# Patient Record
Sex: Female | Born: 1945 | Race: White | Hispanic: No | Smoking: Former smoker
Health system: Southern US, Community
[De-identification: ages and names within clinical notes are randomized; demographics above are authoritative.]

## PROBLEM LIST (undated history)

## (undated) DIAGNOSIS — R0789 Other chest pain: Secondary | ICD-10-CM

## (undated) DIAGNOSIS — R002 Palpitations: Secondary | ICD-10-CM

## (undated) HISTORY — DX: Palpitations: R00.2

## (undated) HISTORY — DX: Other chest pain: R07.89

---

## 2012-02-22 ENCOUNTER — Other Ambulatory Visit (HOSPITAL_COMMUNITY)
Admission: RE | Admit: 2012-02-22 | Discharge: 2012-02-22 | Disposition: A | Discharge status: Home / Self Care | Payer: Self-pay | Source: Ambulatory Visit | Attending: Family Medicine | Admitting: Family Medicine

## 2012-02-22 DIAGNOSIS — Z124 Encounter for screening for malignant neoplasm of cervix: Secondary | ICD-10-CM | POA: Insufficient documentation

## 2012-02-26 ENCOUNTER — Other Ambulatory Visit: Payer: Self-pay | Admitting: Family Medicine

## 2012-02-26 DIAGNOSIS — Z780 Asymptomatic menopausal state: Secondary | ICD-10-CM

## 2012-02-26 DIAGNOSIS — Z1231 Encounter for screening mammogram for malignant neoplasm of breast: Secondary | ICD-10-CM

## 2012-03-29 ENCOUNTER — Ambulatory Visit
Admission: RE | Admit: 2012-03-29 | Discharge: 2012-03-29 | Disposition: A | Discharge status: Home / Self Care | Payer: BC Managed Care – PPO | Source: Ambulatory Visit | Attending: Family Medicine | Admitting: Family Medicine

## 2012-03-29 DIAGNOSIS — Z780 Asymptomatic menopausal state: Secondary | ICD-10-CM

## 2012-03-29 DIAGNOSIS — Z1231 Encounter for screening mammogram for malignant neoplasm of breast: Secondary | ICD-10-CM

## 2013-04-24 ENCOUNTER — Other Ambulatory Visit: Payer: Self-pay

## 2013-04-24 DIAGNOSIS — Z1231 Encounter for screening mammogram for malignant neoplasm of breast: Secondary | ICD-10-CM

## 2013-05-19 ENCOUNTER — Ambulatory Visit
Admission: RE | Admit: 2013-05-19 | Discharge: 2013-05-19 | Disposition: A | Discharge status: Home / Self Care | Payer: BC Managed Care – PPO | Source: Ambulatory Visit

## 2013-05-19 DIAGNOSIS — Z1231 Encounter for screening mammogram for malignant neoplasm of breast: Secondary | ICD-10-CM

## 2013-09-29 DIAGNOSIS — M542 Cervicalgia: Secondary | ICD-10-CM | POA: Diagnosis not present

## 2013-09-29 DIAGNOSIS — M545 Low back pain, unspecified: Secondary | ICD-10-CM | POA: Diagnosis not present

## 2013-10-23 DIAGNOSIS — M545 Low back pain, unspecified: Secondary | ICD-10-CM | POA: Diagnosis not present

## 2013-10-23 DIAGNOSIS — M542 Cervicalgia: Secondary | ICD-10-CM | POA: Diagnosis not present

## 2013-11-10 DIAGNOSIS — M542 Cervicalgia: Secondary | ICD-10-CM | POA: Diagnosis not present

## 2013-11-10 DIAGNOSIS — M545 Low back pain, unspecified: Secondary | ICD-10-CM | POA: Diagnosis not present

## 2013-11-28 DIAGNOSIS — M542 Cervicalgia: Secondary | ICD-10-CM | POA: Diagnosis not present

## 2013-11-28 DIAGNOSIS — M545 Low back pain, unspecified: Secondary | ICD-10-CM | POA: Diagnosis not present

## 2013-12-11 DIAGNOSIS — H4011X0 Primary open-angle glaucoma, stage unspecified: Secondary | ICD-10-CM | POA: Diagnosis not present

## 2013-12-15 DIAGNOSIS — M545 Low back pain, unspecified: Secondary | ICD-10-CM | POA: Diagnosis not present

## 2013-12-15 DIAGNOSIS — M542 Cervicalgia: Secondary | ICD-10-CM | POA: Diagnosis not present

## 2013-12-29 DIAGNOSIS — M545 Low back pain, unspecified: Secondary | ICD-10-CM | POA: Diagnosis not present

## 2013-12-29 DIAGNOSIS — M542 Cervicalgia: Secondary | ICD-10-CM | POA: Diagnosis not present

## 2014-01-12 DIAGNOSIS — M542 Cervicalgia: Secondary | ICD-10-CM | POA: Diagnosis not present

## 2014-01-12 DIAGNOSIS — M545 Low back pain, unspecified: Secondary | ICD-10-CM | POA: Diagnosis not present

## 2014-02-04 DIAGNOSIS — M545 Low back pain, unspecified: Secondary | ICD-10-CM | POA: Diagnosis not present

## 2014-02-04 DIAGNOSIS — M542 Cervicalgia: Secondary | ICD-10-CM | POA: Diagnosis not present

## 2014-02-11 DIAGNOSIS — M545 Low back pain, unspecified: Secondary | ICD-10-CM | POA: Diagnosis not present

## 2014-02-11 DIAGNOSIS — M542 Cervicalgia: Secondary | ICD-10-CM | POA: Diagnosis not present

## 2014-02-25 DIAGNOSIS — Z131 Encounter for screening for diabetes mellitus: Secondary | ICD-10-CM | POA: Diagnosis not present

## 2014-02-25 DIAGNOSIS — Z136 Encounter for screening for cardiovascular disorders: Secondary | ICD-10-CM | POA: Diagnosis not present

## 2014-02-25 DIAGNOSIS — H409 Unspecified glaucoma: Secondary | ICD-10-CM | POA: Diagnosis not present

## 2014-02-25 DIAGNOSIS — Z1331 Encounter for screening for depression: Secondary | ICD-10-CM | POA: Diagnosis not present

## 2014-02-25 DIAGNOSIS — Z0000 Encounter for general adult medical examination without abnormal findings: Secondary | ICD-10-CM | POA: Diagnosis not present

## 2014-02-25 DIAGNOSIS — M899 Disorder of bone, unspecified: Secondary | ICD-10-CM | POA: Diagnosis not present

## 2014-02-25 DIAGNOSIS — Z23 Encounter for immunization: Secondary | ICD-10-CM | POA: Diagnosis not present

## 2014-02-25 DIAGNOSIS — M949 Disorder of cartilage, unspecified: Secondary | ICD-10-CM | POA: Diagnosis not present

## 2014-03-02 DIAGNOSIS — M545 Low back pain, unspecified: Secondary | ICD-10-CM | POA: Diagnosis not present

## 2014-03-02 DIAGNOSIS — M542 Cervicalgia: Secondary | ICD-10-CM | POA: Diagnosis not present

## 2014-03-23 DIAGNOSIS — M542 Cervicalgia: Secondary | ICD-10-CM | POA: Diagnosis not present

## 2014-03-23 DIAGNOSIS — M545 Low back pain, unspecified: Secondary | ICD-10-CM | POA: Diagnosis not present

## 2014-03-30 DIAGNOSIS — M899 Disorder of bone, unspecified: Secondary | ICD-10-CM | POA: Diagnosis not present

## 2014-03-30 DIAGNOSIS — M949 Disorder of cartilage, unspecified: Secondary | ICD-10-CM | POA: Diagnosis not present

## 2014-04-14 DIAGNOSIS — M545 Low back pain, unspecified: Secondary | ICD-10-CM | POA: Diagnosis not present

## 2014-04-14 DIAGNOSIS — M542 Cervicalgia: Secondary | ICD-10-CM | POA: Diagnosis not present

## 2014-04-15 DIAGNOSIS — M7750 Other enthesopathy of unspecified foot: Secondary | ICD-10-CM | POA: Diagnosis not present

## 2014-04-29 DIAGNOSIS — M542 Cervicalgia: Secondary | ICD-10-CM | POA: Diagnosis not present

## 2014-04-29 DIAGNOSIS — M545 Low back pain, unspecified: Secondary | ICD-10-CM | POA: Diagnosis not present

## 2014-05-13 DIAGNOSIS — M545 Low back pain, unspecified: Secondary | ICD-10-CM | POA: Diagnosis not present

## 2014-05-13 DIAGNOSIS — M542 Cervicalgia: Secondary | ICD-10-CM | POA: Diagnosis not present

## 2014-05-20 ENCOUNTER — Other Ambulatory Visit: Payer: Self-pay

## 2014-05-20 DIAGNOSIS — Z1231 Encounter for screening mammogram for malignant neoplasm of breast: Secondary | ICD-10-CM

## 2014-05-21 DIAGNOSIS — M545 Low back pain, unspecified: Secondary | ICD-10-CM | POA: Diagnosis not present

## 2014-05-21 DIAGNOSIS — M542 Cervicalgia: Secondary | ICD-10-CM | POA: Diagnosis not present

## 2014-06-04 ENCOUNTER — Ambulatory Visit: Payer: BC Managed Care – PPO

## 2014-06-04 DIAGNOSIS — R42 Dizziness and giddiness: Secondary | ICD-10-CM | POA: Diagnosis not present

## 2014-06-04 DIAGNOSIS — H6590 Unspecified nonsuppurative otitis media, unspecified ear: Secondary | ICD-10-CM | POA: Diagnosis not present

## 2014-06-10 DIAGNOSIS — H43812 Vitreous degeneration, left eye: Secondary | ICD-10-CM | POA: Diagnosis not present

## 2014-06-11 DIAGNOSIS — M47813 Spondylosis without myelopathy or radiculopathy, cervicothoracic region: Secondary | ICD-10-CM | POA: Diagnosis not present

## 2014-06-11 DIAGNOSIS — M9901 Segmental and somatic dysfunction of cervical region: Secondary | ICD-10-CM | POA: Diagnosis not present

## 2014-06-12 ENCOUNTER — Ambulatory Visit
Admission: RE | Admit: 2014-06-12 | Discharge: 2014-06-12 | Disposition: A | Discharge status: Home / Self Care | Payer: Medicare Other | Source: Ambulatory Visit

## 2014-06-12 DIAGNOSIS — Z1231 Encounter for screening mammogram for malignant neoplasm of breast: Secondary | ICD-10-CM

## 2014-06-19 DIAGNOSIS — R05 Cough: Secondary | ICD-10-CM | POA: Diagnosis not present

## 2014-06-26 DIAGNOSIS — Z23 Encounter for immunization: Secondary | ICD-10-CM | POA: Diagnosis not present

## 2014-06-29 DIAGNOSIS — M47813 Spondylosis without myelopathy or radiculopathy, cervicothoracic region: Secondary | ICD-10-CM | POA: Diagnosis not present

## 2014-06-29 DIAGNOSIS — M9901 Segmental and somatic dysfunction of cervical region: Secondary | ICD-10-CM | POA: Diagnosis not present

## 2014-09-09 DIAGNOSIS — M47813 Spondylosis without myelopathy or radiculopathy, cervicothoracic region: Secondary | ICD-10-CM | POA: Diagnosis not present

## 2014-09-09 DIAGNOSIS — M9901 Segmental and somatic dysfunction of cervical region: Secondary | ICD-10-CM | POA: Diagnosis not present

## 2014-09-28 DIAGNOSIS — M47813 Spondylosis without myelopathy or radiculopathy, cervicothoracic region: Secondary | ICD-10-CM | POA: Diagnosis not present

## 2014-09-28 DIAGNOSIS — M9901 Segmental and somatic dysfunction of cervical region: Secondary | ICD-10-CM | POA: Diagnosis not present

## 2014-10-15 DIAGNOSIS — M47813 Spondylosis without myelopathy or radiculopathy, cervicothoracic region: Secondary | ICD-10-CM | POA: Diagnosis not present

## 2014-10-15 DIAGNOSIS — M9901 Segmental and somatic dysfunction of cervical region: Secondary | ICD-10-CM | POA: Diagnosis not present

## 2014-11-02 DIAGNOSIS — M9901 Segmental and somatic dysfunction of cervical region: Secondary | ICD-10-CM | POA: Diagnosis not present

## 2014-11-02 DIAGNOSIS — M47813 Spondylosis without myelopathy or radiculopathy, cervicothoracic region: Secondary | ICD-10-CM | POA: Diagnosis not present

## 2014-11-18 DIAGNOSIS — M9901 Segmental and somatic dysfunction of cervical region: Secondary | ICD-10-CM | POA: Diagnosis not present

## 2014-11-18 DIAGNOSIS — M47813 Spondylosis without myelopathy or radiculopathy, cervicothoracic region: Secondary | ICD-10-CM | POA: Diagnosis not present

## 2014-11-27 DIAGNOSIS — M9901 Segmental and somatic dysfunction of cervical region: Secondary | ICD-10-CM | POA: Diagnosis not present

## 2014-11-27 DIAGNOSIS — M47813 Spondylosis without myelopathy or radiculopathy, cervicothoracic region: Secondary | ICD-10-CM | POA: Diagnosis not present

## 2014-12-10 DIAGNOSIS — M47813 Spondylosis without myelopathy or radiculopathy, cervicothoracic region: Secondary | ICD-10-CM | POA: Diagnosis not present

## 2014-12-10 DIAGNOSIS — Z23 Encounter for immunization: Secondary | ICD-10-CM | POA: Diagnosis not present

## 2014-12-10 DIAGNOSIS — Z7189 Other specified counseling: Secondary | ICD-10-CM | POA: Diagnosis not present

## 2014-12-10 DIAGNOSIS — M9901 Segmental and somatic dysfunction of cervical region: Secondary | ICD-10-CM | POA: Diagnosis not present

## 2014-12-14 DIAGNOSIS — H26033 Infantile and juvenile nuclear cataract, bilateral: Secondary | ICD-10-CM | POA: Diagnosis not present

## 2014-12-24 DIAGNOSIS — M9901 Segmental and somatic dysfunction of cervical region: Secondary | ICD-10-CM | POA: Diagnosis not present

## 2014-12-24 DIAGNOSIS — M47813 Spondylosis without myelopathy or radiculopathy, cervicothoracic region: Secondary | ICD-10-CM | POA: Diagnosis not present

## 2015-01-12 DIAGNOSIS — M9901 Segmental and somatic dysfunction of cervical region: Secondary | ICD-10-CM | POA: Diagnosis not present

## 2015-01-12 DIAGNOSIS — M47813 Spondylosis without myelopathy or radiculopathy, cervicothoracic region: Secondary | ICD-10-CM | POA: Diagnosis not present

## 2015-01-28 DIAGNOSIS — M9901 Segmental and somatic dysfunction of cervical region: Secondary | ICD-10-CM | POA: Diagnosis not present

## 2015-01-28 DIAGNOSIS — M47813 Spondylosis without myelopathy or radiculopathy, cervicothoracic region: Secondary | ICD-10-CM | POA: Diagnosis not present

## 2015-02-17 DIAGNOSIS — M9901 Segmental and somatic dysfunction of cervical region: Secondary | ICD-10-CM | POA: Diagnosis not present

## 2015-02-17 DIAGNOSIS — M47813 Spondylosis without myelopathy or radiculopathy, cervicothoracic region: Secondary | ICD-10-CM | POA: Diagnosis not present

## 2015-03-02 DIAGNOSIS — M9901 Segmental and somatic dysfunction of cervical region: Secondary | ICD-10-CM | POA: Diagnosis not present

## 2015-03-02 DIAGNOSIS — M47813 Spondylosis without myelopathy or radiculopathy, cervicothoracic region: Secondary | ICD-10-CM | POA: Diagnosis not present

## 2015-03-03 DIAGNOSIS — Z1389 Encounter for screening for other disorder: Secondary | ICD-10-CM | POA: Diagnosis not present

## 2015-03-03 DIAGNOSIS — M8580 Other specified disorders of bone density and structure, unspecified site: Secondary | ICD-10-CM | POA: Diagnosis not present

## 2015-03-03 DIAGNOSIS — Z8659 Personal history of other mental and behavioral disorders: Secondary | ICD-10-CM | POA: Diagnosis not present

## 2015-03-03 DIAGNOSIS — R5381 Other malaise: Secondary | ICD-10-CM | POA: Diagnosis not present

## 2015-03-03 DIAGNOSIS — Z0000 Encounter for general adult medical examination without abnormal findings: Secondary | ICD-10-CM | POA: Diagnosis not present

## 2015-03-03 DIAGNOSIS — Z23 Encounter for immunization: Secondary | ICD-10-CM | POA: Diagnosis not present

## 2015-03-16 DIAGNOSIS — H4011X2 Primary open-angle glaucoma, moderate stage: Secondary | ICD-10-CM | POA: Diagnosis not present

## 2015-03-18 DIAGNOSIS — M47813 Spondylosis without myelopathy or radiculopathy, cervicothoracic region: Secondary | ICD-10-CM | POA: Diagnosis not present

## 2015-03-18 DIAGNOSIS — M9901 Segmental and somatic dysfunction of cervical region: Secondary | ICD-10-CM | POA: Diagnosis not present

## 2015-04-05 DIAGNOSIS — M47813 Spondylosis without myelopathy or radiculopathy, cervicothoracic region: Secondary | ICD-10-CM | POA: Diagnosis not present

## 2015-04-05 DIAGNOSIS — M9901 Segmental and somatic dysfunction of cervical region: Secondary | ICD-10-CM | POA: Diagnosis not present

## 2015-04-20 DIAGNOSIS — M9901 Segmental and somatic dysfunction of cervical region: Secondary | ICD-10-CM | POA: Diagnosis not present

## 2015-04-20 DIAGNOSIS — M47813 Spondylosis without myelopathy or radiculopathy, cervicothoracic region: Secondary | ICD-10-CM | POA: Diagnosis not present

## 2015-04-27 DIAGNOSIS — M47813 Spondylosis without myelopathy or radiculopathy, cervicothoracic region: Secondary | ICD-10-CM | POA: Diagnosis not present

## 2015-04-27 DIAGNOSIS — M9901 Segmental and somatic dysfunction of cervical region: Secondary | ICD-10-CM | POA: Diagnosis not present

## 2015-05-03 DIAGNOSIS — M47813 Spondylosis without myelopathy or radiculopathy, cervicothoracic region: Secondary | ICD-10-CM | POA: Diagnosis not present

## 2015-05-03 DIAGNOSIS — M9901 Segmental and somatic dysfunction of cervical region: Secondary | ICD-10-CM | POA: Diagnosis not present

## 2015-05-17 DIAGNOSIS — M47813 Spondylosis without myelopathy or radiculopathy, cervicothoracic region: Secondary | ICD-10-CM | POA: Diagnosis not present

## 2015-05-17 DIAGNOSIS — M9901 Segmental and somatic dysfunction of cervical region: Secondary | ICD-10-CM | POA: Diagnosis not present

## 2015-05-24 DIAGNOSIS — L821 Other seborrheic keratosis: Secondary | ICD-10-CM | POA: Diagnosis not present

## 2015-05-24 DIAGNOSIS — B078 Other viral warts: Secondary | ICD-10-CM | POA: Diagnosis not present

## 2015-05-24 DIAGNOSIS — Z809 Family history of malignant neoplasm, unspecified: Secondary | ICD-10-CM | POA: Diagnosis not present

## 2015-05-24 DIAGNOSIS — Z1283 Encounter for screening for malignant neoplasm of skin: Secondary | ICD-10-CM | POA: Diagnosis not present

## 2015-06-07 DIAGNOSIS — M9901 Segmental and somatic dysfunction of cervical region: Secondary | ICD-10-CM | POA: Diagnosis not present

## 2015-06-07 DIAGNOSIS — M47813 Spondylosis without myelopathy or radiculopathy, cervicothoracic region: Secondary | ICD-10-CM | POA: Diagnosis not present

## 2015-06-11 DIAGNOSIS — M47813 Spondylosis without myelopathy or radiculopathy, cervicothoracic region: Secondary | ICD-10-CM | POA: Diagnosis not present

## 2015-06-11 DIAGNOSIS — M9901 Segmental and somatic dysfunction of cervical region: Secondary | ICD-10-CM | POA: Diagnosis not present

## 2015-06-15 DIAGNOSIS — H401112 Primary open-angle glaucoma, right eye, moderate stage: Secondary | ICD-10-CM | POA: Diagnosis not present

## 2015-06-18 ENCOUNTER — Other Ambulatory Visit (HOSPITAL_COMMUNITY): Payer: Self-pay | Admitting: Optometry

## 2015-06-18 DIAGNOSIS — H539 Unspecified visual disturbance: Secondary | ICD-10-CM

## 2015-06-21 ENCOUNTER — Other Ambulatory Visit (HOSPITAL_COMMUNITY): Payer: Self-pay | Admitting: Family Medicine

## 2015-06-21 ENCOUNTER — Other Ambulatory Visit (HOSPITAL_COMMUNITY): Payer: Self-pay | Admitting: Optometry

## 2015-06-21 DIAGNOSIS — H539 Unspecified visual disturbance: Secondary | ICD-10-CM

## 2015-06-22 ENCOUNTER — Ambulatory Visit (HOSPITAL_COMMUNITY)
Admission: RE | Admit: 2015-06-22 | Discharge: 2015-06-22 | Disposition: A | Discharge status: Home / Self Care | Payer: Medicare Other | Source: Ambulatory Visit | Attending: Cardiovascular Disease | Admitting: Cardiovascular Disease

## 2015-06-22 ENCOUNTER — Other Ambulatory Visit (HOSPITAL_COMMUNITY): Payer: Self-pay | Admitting: Optometry

## 2015-06-22 DIAGNOSIS — H539 Unspecified visual disturbance: Secondary | ICD-10-CM | POA: Diagnosis not present

## 2015-06-22 DIAGNOSIS — H47021 Hemorrhage in optic nerve sheath, right eye: Secondary | ICD-10-CM

## 2015-06-28 DIAGNOSIS — M47813 Spondylosis without myelopathy or radiculopathy, cervicothoracic region: Secondary | ICD-10-CM | POA: Diagnosis not present

## 2015-06-28 DIAGNOSIS — M9901 Segmental and somatic dysfunction of cervical region: Secondary | ICD-10-CM | POA: Diagnosis not present

## 2015-07-05 ENCOUNTER — Other Ambulatory Visit: Payer: Self-pay

## 2015-07-05 DIAGNOSIS — Z1231 Encounter for screening mammogram for malignant neoplasm of breast: Secondary | ICD-10-CM

## 2015-07-15 DIAGNOSIS — M47813 Spondylosis without myelopathy or radiculopathy, cervicothoracic region: Secondary | ICD-10-CM | POA: Diagnosis not present

## 2015-07-15 DIAGNOSIS — M9901 Segmental and somatic dysfunction of cervical region: Secondary | ICD-10-CM | POA: Diagnosis not present

## 2015-08-02 DIAGNOSIS — M47813 Spondylosis without myelopathy or radiculopathy, cervicothoracic region: Secondary | ICD-10-CM | POA: Diagnosis not present

## 2015-08-02 DIAGNOSIS — M9901 Segmental and somatic dysfunction of cervical region: Secondary | ICD-10-CM | POA: Diagnosis not present

## 2015-08-10 ENCOUNTER — Ambulatory Visit
Admission: RE | Admit: 2015-08-10 | Discharge: 2015-08-10 | Disposition: A | Discharge status: Home / Self Care | Payer: Medicare Other | Source: Ambulatory Visit

## 2015-08-10 DIAGNOSIS — Z1231 Encounter for screening mammogram for malignant neoplasm of breast: Secondary | ICD-10-CM

## 2015-08-19 DIAGNOSIS — M9901 Segmental and somatic dysfunction of cervical region: Secondary | ICD-10-CM | POA: Diagnosis not present

## 2015-08-19 DIAGNOSIS — M47813 Spondylosis without myelopathy or radiculopathy, cervicothoracic region: Secondary | ICD-10-CM | POA: Diagnosis not present

## 2015-09-08 DIAGNOSIS — M47813 Spondylosis without myelopathy or radiculopathy, cervicothoracic region: Secondary | ICD-10-CM | POA: Diagnosis not present

## 2015-09-08 DIAGNOSIS — M9901 Segmental and somatic dysfunction of cervical region: Secondary | ICD-10-CM | POA: Diagnosis not present

## 2015-09-14 DIAGNOSIS — H401121 Primary open-angle glaucoma, left eye, mild stage: Secondary | ICD-10-CM | POA: Diagnosis not present

## 2015-10-01 DIAGNOSIS — M47813 Spondylosis without myelopathy or radiculopathy, cervicothoracic region: Secondary | ICD-10-CM | POA: Diagnosis not present

## 2015-10-01 DIAGNOSIS — M9901 Segmental and somatic dysfunction of cervical region: Secondary | ICD-10-CM | POA: Diagnosis not present

## 2015-10-22 DIAGNOSIS — M47813 Spondylosis without myelopathy or radiculopathy, cervicothoracic region: Secondary | ICD-10-CM | POA: Diagnosis not present

## 2015-10-22 DIAGNOSIS — M9901 Segmental and somatic dysfunction of cervical region: Secondary | ICD-10-CM | POA: Diagnosis not present

## 2015-11-08 DIAGNOSIS — M9901 Segmental and somatic dysfunction of cervical region: Secondary | ICD-10-CM | POA: Diagnosis not present

## 2015-11-08 DIAGNOSIS — M47813 Spondylosis without myelopathy or radiculopathy, cervicothoracic region: Secondary | ICD-10-CM | POA: Diagnosis not present

## 2015-11-18 ENCOUNTER — Ambulatory Visit
Admission: RE | Admit: 2015-11-18 | Discharge: 2015-11-18 | Disposition: A | Discharge status: Home / Self Care | Payer: Medicare Other | Source: Ambulatory Visit | Attending: Physician Assistant | Admitting: Physician Assistant

## 2015-11-18 ENCOUNTER — Other Ambulatory Visit: Payer: Self-pay | Admitting: Physician Assistant

## 2015-11-18 DIAGNOSIS — S0992XA Unspecified injury of nose, initial encounter: Secondary | ICD-10-CM

## 2015-11-18 DIAGNOSIS — J3489 Other specified disorders of nose and nasal sinuses: Secondary | ICD-10-CM | POA: Diagnosis not present

## 2015-11-18 DIAGNOSIS — R001 Bradycardia, unspecified: Secondary | ICD-10-CM | POA: Diagnosis not present

## 2015-11-18 DIAGNOSIS — R55 Syncope and collapse: Secondary | ICD-10-CM | POA: Diagnosis not present

## 2015-11-18 DIAGNOSIS — S022XXA Fracture of nasal bones, initial encounter for closed fracture: Secondary | ICD-10-CM | POA: Diagnosis not present

## 2015-11-18 DIAGNOSIS — R079 Chest pain, unspecified: Secondary | ICD-10-CM | POA: Diagnosis not present

## 2015-11-19 DIAGNOSIS — M9901 Segmental and somatic dysfunction of cervical region: Secondary | ICD-10-CM | POA: Diagnosis not present

## 2015-11-19 DIAGNOSIS — M47813 Spondylosis without myelopathy or radiculopathy, cervicothoracic region: Secondary | ICD-10-CM | POA: Diagnosis not present

## 2015-11-22 ENCOUNTER — Telehealth: Payer: Self-pay | Admitting: Cardiovascular Disease

## 2015-11-22 DIAGNOSIS — M47813 Spondylosis without myelopathy or radiculopathy, cervicothoracic region: Secondary | ICD-10-CM | POA: Diagnosis not present

## 2015-11-22 DIAGNOSIS — M9901 Segmental and somatic dysfunction of cervical region: Secondary | ICD-10-CM | POA: Diagnosis not present

## 2015-11-22 NOTE — Telephone Encounter (Signed)
Received records from [facility] for appointment on 12/09/15 with Dr Oval Linsey.  Records given to [facility] (medical records) for Dr Blenda Mounts schedule on 12/09/15. lp

## 2015-11-30 DIAGNOSIS — S022XXA Fracture of nasal bones, initial encounter for closed fracture: Secondary | ICD-10-CM | POA: Diagnosis not present

## 2015-12-06 DIAGNOSIS — M9901 Segmental and somatic dysfunction of cervical region: Secondary | ICD-10-CM | POA: Diagnosis not present

## 2015-12-06 DIAGNOSIS — M47813 Spondylosis without myelopathy or radiculopathy, cervicothoracic region: Secondary | ICD-10-CM | POA: Diagnosis not present

## 2015-12-09 ENCOUNTER — Encounter: Payer: Self-pay | Admitting: Cardiovascular Disease

## 2015-12-09 ENCOUNTER — Encounter (INDEPENDENT_AMBULATORY_CARE_PROVIDER_SITE_OTHER): Payer: Medicare Other

## 2015-12-09 ENCOUNTER — Ambulatory Visit (INDEPENDENT_AMBULATORY_CARE_PROVIDER_SITE_OTHER): Payer: Medicare Other | Admitting: Cardiovascular Disease

## 2015-12-09 VITALS — BP 130/86 | HR 63 | Ht 64.5 in | Wt 140.8 lb

## 2015-12-09 DIAGNOSIS — R0789 Other chest pain: Secondary | ICD-10-CM

## 2015-12-09 DIAGNOSIS — R002 Palpitations: Secondary | ICD-10-CM

## 2015-12-09 DIAGNOSIS — R079 Chest pain, unspecified: Secondary | ICD-10-CM | POA: Diagnosis not present

## 2015-12-09 DIAGNOSIS — R0602 Shortness of breath: Secondary | ICD-10-CM | POA: Diagnosis not present

## 2015-12-09 HISTORY — DX: Palpitations: R00.2

## 2015-12-09 HISTORY — DX: Other chest pain: R07.89

## 2015-12-09 NOTE — Progress Notes (Signed)
Cardiology Office Note   Date:  12/09/2015   ID:  Tina Wagner, DOB May 07, 1946, MRN [id number]  PCP:  Gerrit Heck, MD  Cardiologist:   Sharol Harness, MD   Chief Complaint  Patient presents with  . Heart pounding with mild chest pain      History of Present Illness: Tina Wagner is a 70 y.o. female with depression who presents for an evaluation of palpitations. Tina Wagner saw her PCP, [name], on 3/16.   At that appointment she reported episodes of heart pounding, left-sided chest pain, and presyncope.  She was noted to be bradycardic, though her heart rate at the time was 72 bpm.  Presumably this was noted on her EKG.  She has noted that her heart is beating harder lately.  She typically notes it wen she is sitting quietly.  It is not irregular and lasts for a few seconds.  She finds it harder to take a deep breath when this occurs.  She denies associated lightheadedness, nausea, vomiting, or diaphoresis.  She also has not noted any lower extremity edema, orthopnea or PND. Of note, Tina Wagner reports that 2 of her close friends were diagnosed with ALS and avanced heart disease  around the same time that the symptoms started. She wonders if stress can be contributing to the symptoms.  The episodes. More frequently in January and March. It was better for about 3 weeks but in the last week she's had several episodes again.  Tina Wagner  also endorses short twinges of chest pain. This happens approximately 4 times per month. She is unsure whether she gets short of breath. It typically occurs at rest. She exercises twice per week and does not get chest pain or shortness of breath with this activity.  Her father had heart disease in his 20s and she is worried that she may have the same.   Past Medical History  Diagnosis Date  . Atypical chest pain 12/09/2015  . Palpitations 12/09/2015    No past surgical history on file.   Current Outpatient Prescriptions  Medication  Sig Dispense Refill  . dorzolamide-timolol (COSOPT) 22.3-6.8 MG/ML ophthalmic solution Place 1 drop into both eyes 2 (two) times daily.  5  . Multiple Vitamins-Minerals (MULTIVITAMIN PO) Take 1 tablet by mouth daily.    . TRAVATAN Z 0.004 % SOLN ophthalmic solution Place 1 drop into both eyes at bedtime.  4   No current facility-administered medications for this visit.    Allergies:   Review of patient's allergies indicates no known allergies.    Social History:  The patient  reports that she has quit smoking. She has never used smokeless tobacco.   Family History:  The patient's family history includes COPD in her mother; Heart disease in her father and maternal grandfather; Lung disease in her maternal grandmother; Osteoporosis in her mother.    ROS:  Please see the history of present illness.   Otherwise, review of systems are positive for orthostasis.   All other systems are reviewed and negative.    PHYSICAL EXAM: VS:  BP 130/86 mmHg  Pulse 63  Ht 5' 4.5" (1.638 m)  Wt 63.866 kg (140 lb 12.8 oz)  BMI 23.80 kg/m2 , BMI Body mass index is 23.8 kg/(m^2). GENERAL:  Well appearing HEENT:  Pupils equal round and reactive, fundi not visualized, oral mucosa unremarkable NECK:  No jugular venous distention, waveform within normal limits, carotid upstroke brisk and symmetric, no bruits, no thyromegaly  LYMPHATICS:  No cervical adenopathy LUNGS:  Clear to auscultation bilaterally HEART:  RRR.  PMI not displaced or sustained,S1 and S2 within normal limits, no S3, no S4, no clicks, no rubs, no murmurs ABD:  Flat, positive bowel sounds normal in frequency in pitch, no bruits, no rebound, no guarding, no midline pulsatile mass, no hepatomegaly, no splenomegaly EXT:  2 plus pulses throughout, no edema, no cyanosis no clubbing SKIN:  No rashes no nodules NEURO:  Cranial nerves II through XII grossly intact, motor grossly intact throughout PSYCH:  Cognitively intact, oriented to person place and  time    EKG:  EKG is ordered today. The ekg ordered today demonstratesSinus rhythm. Rate 63 bpm.  Recent Labs: No results found for requested labs within last 365 days.    Lipid Panel No results found for: CHOL, TRIG, HDL, CHOLHDL, VLDL, LDLCALC, LDLDIRECT    Wt Readings from Last 3 Encounters:  12/09/15 63.866 kg (140 lb 12.8 oz)      ASSESSMENT AND PLAN:  # Palpitations: It is unclear what is causing her palpitations. It is likely that she has PACs or PVCs. Given that it occurred at the same time as some social stresses, it may also be related to stress and anxiety. We will obtain a 7 day event monitor and request the laboratory records from her PCP.   # Chest pain: This seems unlikely that her symptoms are due to ischemia given that they happen at rest and not with exertion. However, she does have a family history of heart disease and 10. Therefore, we will obtain a treadmill stress test to evaluate for ischemia.    Current medicines are reviewed at length with the patient today.  The patient does not have concerns regarding medicines.  The following changes have been made:  no change  Labs/ tests ordered today include:   Orders Placed This Encounter  Procedures  . Cardiac event monitor  . Exercise Tolerance Test  . EKG 12-Lead     Disposition:   FU with Jasminemarie Sherrard C. Oval Linsey, MD, [facility] in 1 month.    This note was written with the assistance of speech recognition software.  Please excuse any transcriptional errors.  Signed, Toriana Sponsel C. Oval Linsey, MD, [facility]  12/09/2015 12:23 PM    [facility]

## 2015-12-09 NOTE — Patient Instructions (Addendum)
Medication Instructions:  Your physician recommends that you continue on your current medications as directed. Please refer to the Current Medication list given to you today.  Labwork: NONE  Testing/Procedures: Your physician has requested that you have an exercise tolerance test. For further information please visit [email or web address]. Please also follow instruction sheet, as given.  Your physician has recommended that you wear an event monitor. Event monitors are medical devices that record the heart's electrical activity. Doctors most often [place] these monitors to diagnose arrhythmias. Arrhythmias are problems with the speed or rhythm of the heartbeat. The monitor is a small, portable device. You can wear one while you do your normal daily activities. This is usually used to diagnose what is causing palpitations/syncope (passing out). 7 DAY   Follow-Up: Your physician recommends that you schedule a follow-up appointment in: [place]  If you need a refill on your cardiac medications before your next appointment, please call your pharmacy.   Exercise Stress Electrocardiogram An exercise stress electrocardiogram is a test to check how blood flows to your heart. It is done to find areas of poor blood flow. You will need to walk on a treadmill for this test. The electrocardiogram will record your heartbeat when you are at rest and when you are exercising. BEFORE THE PROCEDURE  Do not have drinks with caffeine or foods with caffeine for 24 hours before the test, or as told by your doctor. This includes coffee, tea (even decaf tea), sodas, chocolate, and cocoa.  Follow your doctor's instructions about eating and drinking before the test.  Ask your doctor what medicines you should or should not take before the test. Take your medicines with water unless told by your doctor not to.  If you use an inhaler, bring it with you to the test.  Bring a snack to eat after the test.  Do not  smoke  for 4 hours before the test.  Do not put lotions, powders, creams, or oils on your chest before the test.  Wear comfortable shoes and clothing. PROCEDURE  You will have patches put on your chest. Small areas of your chest may need to be shaved. Wires will be connected to the patches.  Your heart rate will be watched while you are resting and while you are exercising.  You will walk on the treadmill. The treadmill will slowly get faster to raise your heart rate.  The test will take about 1-2 hours. AFTER THE PROCEDURE  Your heart rate and blood pressure will be watched after the test.  You may return to your normal diet, activities, and medicines or as told by your doctor.   This information is not intended to replace advice given to you by your health care provider. Make sure you discuss any questions you have with your health care provider.   Document Released: 02/07/2008 Document Revised: 09/11/2014 Document Reviewed: 04/28/2013 Elsevier Interactive Patient Education [employer].

## 2015-12-10 DIAGNOSIS — R002 Palpitations: Secondary | ICD-10-CM

## 2015-12-21 DIAGNOSIS — H401112 Primary open-angle glaucoma, right eye, moderate stage: Secondary | ICD-10-CM | POA: Diagnosis not present

## 2015-12-23 DIAGNOSIS — M47813 Spondylosis without myelopathy or radiculopathy, cervicothoracic region: Secondary | ICD-10-CM | POA: Diagnosis not present

## 2015-12-23 DIAGNOSIS — M9901 Segmental and somatic dysfunction of cervical region: Secondary | ICD-10-CM | POA: Diagnosis not present

## 2015-12-29 ENCOUNTER — Telehealth (HOSPITAL_COMMUNITY): Payer: Self-pay

## 2015-12-29 NOTE — Telephone Encounter (Signed)
Encounter complete. 

## 2015-12-31 ENCOUNTER — Ambulatory Visit (HOSPITAL_COMMUNITY)
Admission: RE | Admit: 2015-12-31 | Discharge: 2015-12-31 | Disposition: A | Discharge status: Home / Self Care | Payer: Medicare Other | Source: Ambulatory Visit | Attending: Urology | Admitting: Urology

## 2015-12-31 DIAGNOSIS — R079 Chest pain, unspecified: Secondary | ICD-10-CM | POA: Insufficient documentation

## 2015-12-31 DIAGNOSIS — R0602 Shortness of breath: Secondary | ICD-10-CM | POA: Insufficient documentation

## 2015-12-31 LAB — EXERCISE TOLERANCE TEST
Estimated workload: 9.2 METS
Exercise duration (min): 7 min
Exercise duration (sec): 29 s
MPHR: 153 {beats}/min
Peak HR: 137 {beats}/min
Percent HR: 89 %
RPE: 17
Rest HR: 66 {beats}/min

## 2016-01-04 ENCOUNTER — Telehealth: Payer: Self-pay | Admitting: Cardiovascular Disease

## 2016-01-04 NOTE — Telephone Encounter (Signed)
F/u  Pt returning Rn phone call. Please call back and discuss.   

## 2016-01-04 NOTE — Telephone Encounter (Signed)
Results given, pt verbalized understanding & thanks, confirmation of f/u appt next fri. Advised to call if further questions.

## 2016-01-06 DIAGNOSIS — M47813 Spondylosis without myelopathy or radiculopathy, cervicothoracic region: Secondary | ICD-10-CM | POA: Diagnosis not present

## 2016-01-06 DIAGNOSIS — M9901 Segmental and somatic dysfunction of cervical region: Secondary | ICD-10-CM | POA: Diagnosis not present

## 2016-01-10 ENCOUNTER — Telehealth: Payer: Self-pay | Admitting: Cardiovascular Disease

## 2016-01-14 ENCOUNTER — Ambulatory Visit: Payer: Medicare Other | Admitting: Cardiovascular Disease

## 2016-01-14 NOTE — Telephone Encounter (Signed)
Closed encounter °

## 2016-01-17 DIAGNOSIS — M47813 Spondylosis without myelopathy or radiculopathy, cervicothoracic region: Secondary | ICD-10-CM | POA: Diagnosis not present

## 2016-01-17 DIAGNOSIS — M9901 Segmental and somatic dysfunction of cervical region: Secondary | ICD-10-CM | POA: Diagnosis not present
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
## 2016-01-27 ENCOUNTER — Encounter: Payer: Self-pay | Admitting: Cardiovascular Disease

## 2016-01-27 ENCOUNTER — Ambulatory Visit (INDEPENDENT_AMBULATORY_CARE_PROVIDER_SITE_OTHER): Payer: Medicare Other | Admitting: Cardiovascular Disease

## 2016-01-27 VITALS — BP 102/78 | HR 60 | Ht 64.5 in | Wt 138.8 lb

## 2016-01-27 DIAGNOSIS — M47813 Spondylosis without myelopathy or radiculopathy, cervicothoracic region: Secondary | ICD-10-CM | POA: Diagnosis not present

## 2016-01-27 DIAGNOSIS — R002 Palpitations: Secondary | ICD-10-CM | POA: Diagnosis not present

## 2016-01-27 DIAGNOSIS — R0789 Other chest pain: Secondary | ICD-10-CM

## 2016-01-27 DIAGNOSIS — M9901 Segmental and somatic dysfunction of cervical region: Secondary | ICD-10-CM | POA: Diagnosis not present

## 2016-01-27 NOTE — Patient Instructions (Signed)
Medication Instructions:  Your physician recommends that you continue on your current medications as directed. Please refer to the Current Medication list given to you today.  Labwork: none  Testing/Procedures: none  Follow-Up: As needed   If you need a refill on your cardiac medications before your next appointment, please call your pharmacy.  

## 2016-02-10 DIAGNOSIS — M47813 Spondylosis without myelopathy or radiculopathy, cervicothoracic region: Secondary | ICD-10-CM | POA: Diagnosis not present

## 2016-02-10 DIAGNOSIS — M9901 Segmental and somatic dysfunction of cervical region: Secondary | ICD-10-CM | POA: Diagnosis not present

## 2016-02-24 DIAGNOSIS — M9901 Segmental and somatic dysfunction of cervical region: Secondary | ICD-10-CM | POA: Diagnosis not present

## 2016-02-24 DIAGNOSIS — M47813 Spondylosis without myelopathy or radiculopathy, cervicothoracic region: Secondary | ICD-10-CM | POA: Diagnosis not present

## 2016-03-02 DIAGNOSIS — M47813 Spondylosis without myelopathy or radiculopathy, cervicothoracic region: Secondary | ICD-10-CM | POA: Diagnosis not present

## 2016-03-02 DIAGNOSIS — M9901 Segmental and somatic dysfunction of cervical region: Secondary | ICD-10-CM | POA: Diagnosis not present

## 2016-03-14 DIAGNOSIS — M47813 Spondylosis without myelopathy or radiculopathy, cervicothoracic region: Secondary | ICD-10-CM | POA: Diagnosis not present

## 2016-03-14 DIAGNOSIS — M9901 Segmental and somatic dysfunction of cervical region: Secondary | ICD-10-CM | POA: Diagnosis not present

## 2016-03-22 DIAGNOSIS — H401121 Primary open-angle glaucoma, left eye, mild stage: Secondary | ICD-10-CM | POA: Diagnosis not present

## 2016-03-27 DIAGNOSIS — M47813 Spondylosis without myelopathy or radiculopathy, cervicothoracic region: Secondary | ICD-10-CM | POA: Diagnosis not present

## 2016-03-27 DIAGNOSIS — M9901 Segmental and somatic dysfunction of cervical region: Secondary | ICD-10-CM | POA: Diagnosis not present

## 2016-04-13 DIAGNOSIS — Z136 Encounter for screening for cardiovascular disorders: Secondary | ICD-10-CM | POA: Diagnosis not present

## 2016-04-13 DIAGNOSIS — M85852 Other specified disorders of bone density and structure, left thigh: Secondary | ICD-10-CM | POA: Diagnosis not present

## 2016-04-13 DIAGNOSIS — Z0000 Encounter for general adult medical examination without abnormal findings: Secondary | ICD-10-CM | POA: Diagnosis not present

## 2016-04-13 DIAGNOSIS — M8580 Other specified disorders of bone density and structure, unspecified site: Secondary | ICD-10-CM | POA: Diagnosis not present

## 2016-04-17 DIAGNOSIS — M47813 Spondylosis without myelopathy or radiculopathy, cervicothoracic region: Secondary | ICD-10-CM | POA: Diagnosis not present

## 2016-04-17 DIAGNOSIS — M9901 Segmental and somatic dysfunction of cervical region: Secondary | ICD-10-CM | POA: Diagnosis not present

## 2016-04-19 DIAGNOSIS — Z1389 Encounter for screening for other disorder: Secondary | ICD-10-CM | POA: Diagnosis not present

## 2016-04-19 DIAGNOSIS — Z0000 Encounter for general adult medical examination without abnormal findings: Secondary | ICD-10-CM | POA: Diagnosis not present

## 2016-04-19 DIAGNOSIS — M8588 Other specified disorders of bone density and structure, other site: Secondary | ICD-10-CM | POA: Diagnosis not present

## 2016-04-19 DIAGNOSIS — Z23 Encounter for immunization: Secondary | ICD-10-CM | POA: Diagnosis not present

## 2016-04-19 DIAGNOSIS — H409 Unspecified glaucoma: Secondary | ICD-10-CM | POA: Diagnosis not present

## 2016-04-19 DIAGNOSIS — Z8659 Personal history of other mental and behavioral disorders: Secondary | ICD-10-CM | POA: Diagnosis not present

## 2016-04-25 DIAGNOSIS — M47813 Spondylosis without myelopathy or radiculopathy, cervicothoracic region: Secondary | ICD-10-CM | POA: Diagnosis not present

## 2016-04-25 DIAGNOSIS — M9901 Segmental and somatic dysfunction of cervical region: Secondary | ICD-10-CM | POA: Diagnosis not present

## 2016-05-03 DIAGNOSIS — M9901 Segmental and somatic dysfunction of cervical region: Secondary | ICD-10-CM | POA: Diagnosis not present

## 2016-05-03 DIAGNOSIS — M47813 Spondylosis without myelopathy or radiculopathy, cervicothoracic region: Secondary | ICD-10-CM | POA: Diagnosis not present

## 2016-05-10 DIAGNOSIS — M47813 Spondylosis without myelopathy or radiculopathy, cervicothoracic region: Secondary | ICD-10-CM | POA: Diagnosis not present

## 2016-05-10 DIAGNOSIS — M9901 Segmental and somatic dysfunction of cervical region: Secondary | ICD-10-CM | POA: Diagnosis not present

## 2016-05-11 DIAGNOSIS — M8588 Other specified disorders of bone density and structure, other site: Secondary | ICD-10-CM | POA: Diagnosis not present

## 2016-05-18 DIAGNOSIS — M47813 Spondylosis without myelopathy or radiculopathy, cervicothoracic region: Secondary | ICD-10-CM | POA: Diagnosis not present

## 2016-05-18 DIAGNOSIS — M9901 Segmental and somatic dysfunction of cervical region: Secondary | ICD-10-CM | POA: Diagnosis not present

## 2016-05-25 DIAGNOSIS — M47813 Spondylosis without myelopathy or radiculopathy, cervicothoracic region: Secondary | ICD-10-CM | POA: Diagnosis not present

## 2016-05-25 DIAGNOSIS — M9901 Segmental and somatic dysfunction of cervical region: Secondary | ICD-10-CM | POA: Diagnosis not present

## 2016-06-05 DIAGNOSIS — M47813 Spondylosis without myelopathy or radiculopathy, cervicothoracic region: Secondary | ICD-10-CM | POA: Diagnosis not present

## 2016-06-05 DIAGNOSIS — M9901 Segmental and somatic dysfunction of cervical region: Secondary | ICD-10-CM | POA: Diagnosis not present

## 2016-06-16 DIAGNOSIS — M9901 Segmental and somatic dysfunction of cervical region: Secondary | ICD-10-CM | POA: Diagnosis not present

## 2016-06-16 DIAGNOSIS — M47813 Spondylosis without myelopathy or radiculopathy, cervicothoracic region: Secondary | ICD-10-CM | POA: Diagnosis not present

## 2016-06-26 DIAGNOSIS — M47813 Spondylosis without myelopathy or radiculopathy, cervicothoracic region: Secondary | ICD-10-CM | POA: Diagnosis not present

## 2016-06-26 DIAGNOSIS — M9901 Segmental and somatic dysfunction of cervical region: Secondary | ICD-10-CM | POA: Diagnosis not present

## 2016-07-18 DIAGNOSIS — H401112 Primary open-angle glaucoma, right eye, moderate stage: Secondary | ICD-10-CM | POA: Diagnosis not present

## 2016-07-24 DIAGNOSIS — M9901 Segmental and somatic dysfunction of cervical region: Secondary | ICD-10-CM | POA: Diagnosis not present

## 2016-07-24 DIAGNOSIS — M47813 Spondylosis without myelopathy or radiculopathy, cervicothoracic region: Secondary | ICD-10-CM | POA: Diagnosis not present

## 2016-08-09 DIAGNOSIS — M47813 Spondylosis without myelopathy or radiculopathy, cervicothoracic region: Secondary | ICD-10-CM | POA: Diagnosis not present

## 2016-08-09 DIAGNOSIS — M9901 Segmental and somatic dysfunction of cervical region: Secondary | ICD-10-CM | POA: Diagnosis not present

## 2016-08-22 DIAGNOSIS — M47813 Spondylosis without myelopathy or radiculopathy, cervicothoracic region: Secondary | ICD-10-CM | POA: Diagnosis not present

## 2016-08-22 DIAGNOSIS — M9901 Segmental and somatic dysfunction of cervical region: Secondary | ICD-10-CM | POA: Diagnosis not present

## 2016-10-02 ENCOUNTER — Other Ambulatory Visit: Payer: Self-pay | Admitting: Family Medicine

## 2016-10-02 DIAGNOSIS — Z1231 Encounter for screening mammogram for malignant neoplasm of breast: Secondary | ICD-10-CM

## 2016-10-26 ENCOUNTER — Ambulatory Visit
Admission: RE | Admit: 2016-10-26 | Discharge: 2016-10-26 | Disposition: A | Discharge status: Home / Self Care | Payer: Medicare Other | Source: Ambulatory Visit | Attending: Family Medicine | Admitting: Family Medicine

## 2016-10-26 DIAGNOSIS — Z1231 Encounter for screening mammogram for malignant neoplasm of breast: Secondary | ICD-10-CM | POA: Diagnosis not present

## 2016-11-14 DIAGNOSIS — H401112 Primary open-angle glaucoma, right eye, moderate stage: Secondary | ICD-10-CM | POA: Diagnosis not present

## 2017-03-29 DIAGNOSIS — H401121 Primary open-angle glaucoma, left eye, mild stage: Secondary | ICD-10-CM | POA: Diagnosis not present

## 2017-04-26 DIAGNOSIS — Z1389 Encounter for screening for other disorder: Secondary | ICD-10-CM | POA: Diagnosis not present

## 2017-04-26 DIAGNOSIS — Z0000 Encounter for general adult medical examination without abnormal findings: Secondary | ICD-10-CM | POA: Diagnosis not present

## 2017-04-26 DIAGNOSIS — Z23 Encounter for immunization: Secondary | ICD-10-CM | POA: Diagnosis not present

## 2017-04-26 DIAGNOSIS — K30 Functional dyspepsia: Secondary | ICD-10-CM | POA: Diagnosis not present

## 2017-04-26 DIAGNOSIS — Z8659 Personal history of other mental and behavioral disorders: Secondary | ICD-10-CM | POA: Diagnosis not present

## 2017-04-26 DIAGNOSIS — M8588 Other specified disorders of bone density and structure, other site: Secondary | ICD-10-CM | POA: Diagnosis not present

## 2017-04-26 DIAGNOSIS — R202 Paresthesia of skin: Secondary | ICD-10-CM | POA: Diagnosis not present

## 2017-04-26 DIAGNOSIS — H409 Unspecified glaucoma: Secondary | ICD-10-CM | POA: Diagnosis not present

## 2017-04-26 DIAGNOSIS — M5090 Cervical disc disorder, unspecified, unspecified cervical region: Secondary | ICD-10-CM | POA: Diagnosis not present

## 2017-05-02 DIAGNOSIS — M79641 Pain in right hand: Secondary | ICD-10-CM | POA: Diagnosis not present

## 2017-05-02 DIAGNOSIS — G5601 Carpal tunnel syndrome, right upper limb: Secondary | ICD-10-CM | POA: Diagnosis not present

## 2017-05-10 DIAGNOSIS — G5601 Carpal tunnel syndrome, right upper limb: Secondary | ICD-10-CM | POA: Diagnosis not present

## 2017-05-10 DIAGNOSIS — G5602 Carpal tunnel syndrome, left upper limb: Secondary | ICD-10-CM | POA: Diagnosis not present

## 2017-06-01 DIAGNOSIS — G5601 Carpal tunnel syndrome, right upper limb: Secondary | ICD-10-CM | POA: Diagnosis not present

## 2017-06-14 DIAGNOSIS — G5601 Carpal tunnel syndrome, right upper limb: Secondary | ICD-10-CM | POA: Diagnosis not present

## 2017-09-27 DIAGNOSIS — H401112 Primary open-angle glaucoma, right eye, moderate stage: Secondary | ICD-10-CM | POA: Diagnosis not present

## 2017-11-06 DIAGNOSIS — R42 Dizziness and giddiness: Secondary | ICD-10-CM | POA: Diagnosis not present

## 2017-11-06 DIAGNOSIS — R110 Nausea: Secondary | ICD-10-CM | POA: Diagnosis not present

## 2017-11-16 ENCOUNTER — Other Ambulatory Visit: Payer: Self-pay | Admitting: Family Medicine

## 2017-11-16 DIAGNOSIS — Z1231 Encounter for screening mammogram for malignant neoplasm of breast: Secondary | ICD-10-CM

## 2017-11-22 DIAGNOSIS — H401112 Primary open-angle glaucoma, right eye, moderate stage: Secondary | ICD-10-CM | POA: Diagnosis not present

## 2017-12-06 DIAGNOSIS — L0291 Cutaneous abscess, unspecified: Secondary | ICD-10-CM | POA: Diagnosis not present

## 2017-12-07 ENCOUNTER — Ambulatory Visit
Admission: RE | Admit: 2017-12-07 | Discharge: 2017-12-07 | Disposition: A | Discharge status: Home / Self Care | Payer: Medicare Other | Source: Ambulatory Visit | Attending: Family Medicine | Admitting: Family Medicine

## 2017-12-07 DIAGNOSIS — Z1231 Encounter for screening mammogram for malignant neoplasm of breast: Secondary | ICD-10-CM

## 2017-12-27 DIAGNOSIS — H401212 Low-tension glaucoma, right eye, moderate stage: Secondary | ICD-10-CM | POA: Diagnosis not present

## 2017-12-27 DIAGNOSIS — H401221 Low-tension glaucoma, left eye, mild stage: Secondary | ICD-10-CM | POA: Diagnosis not present

## 2018-03-13 ENCOUNTER — Other Ambulatory Visit: Payer: Self-pay

## 2018-03-13 ENCOUNTER — Ambulatory Visit: Payer: Medicare Other | Attending: Family Medicine | Admitting: Rehabilitative and Restorative Service Providers"

## 2018-03-13 DIAGNOSIS — R42 Dizziness and giddiness: Secondary | ICD-10-CM | POA: Diagnosis not present

## 2018-03-13 DIAGNOSIS — R2689 Other abnormalities of gait and mobility: Secondary | ICD-10-CM | POA: Diagnosis not present

## 2018-03-13 NOTE — Patient Instructions (Signed)
Gaze Stabilization - Tip Card  1.Target must remain in focus, not blurry, and appear stationary while head is in motion. 2.Perform exercises with small head movements (45 to either side of midline). 3.Increase speed of head motion so long as target is in focus. 4.If you wear eyeglasses, be sure you can see target through lens (therapist will give specific instructions for bifocal / progressive lenses). 5.These exercises may provoke dizziness or nausea. Work through these symptoms. If too dizzy, slow head movement slightly. Rest between each exercise. 6.Exercises demand concentration; avoid distractions. 7.For safety, perform standing exercises close to a counter, wall, corner, or next to someone.  Copyright  VHI. All rights reserved.   Gaze Stabilization - Standing Feet Apart   Feet shoulder width apart, keeping eyes on target on wall 3 feet away, tilt head down slightly and move head side to side for 30 seconds. Repeat while moving head up and down for 30 seconds. *Work up to tolerating 60 seconds, as able. Do 3 sessions per day.   Copyright  VHI. All rights reserved.

## 2018-03-14 ENCOUNTER — Encounter: Payer: Self-pay | Admitting: Rehabilitative and Restorative Service Providers"
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
## 2018-03-26 DIAGNOSIS — H401112 Primary open-angle glaucoma, right eye, moderate stage: Secondary | ICD-10-CM | POA: Diagnosis not present

## 2018-03-27 DIAGNOSIS — H401131 Primary open-angle glaucoma, bilateral, mild stage: Secondary | ICD-10-CM | POA: Diagnosis not present

## 2018-03-29 ENCOUNTER — Ambulatory Visit: Payer: Medicare Other | Admitting: Rehabilitative and Restorative Service Providers"

## 2018-04-05 ENCOUNTER — Encounter: Payer: Self-pay | Admitting: Rehabilitative and Restorative Service Providers"

## 2018-04-05 ENCOUNTER — Ambulatory Visit: Payer: Medicare Other | Attending: Family Medicine | Admitting: Rehabilitative and Restorative Service Providers"

## 2018-04-05 DIAGNOSIS — R42 Dizziness and giddiness: Secondary | ICD-10-CM | POA: Diagnosis not present

## 2018-04-05 DIAGNOSIS — R2689 Other abnormalities of gait and mobility: Secondary | ICD-10-CM | POA: Insufficient documentation

## 2018-04-05 NOTE — Patient Instructions (Signed)
Gaze Stabilization - Tip Card  1.Target must remain in focus, not blurry, and appear stationary while head is in motion. 2.Perform exercises with small head movements (45 to either side of midline). 3.Increase speed of head motion so long as target is in focus. 4.If you wear eyeglasses, be sure you can see target through lens (therapist will give specific instructions for bifocal / progressive lenses). 5.These exercises may provoke dizziness or nausea. Work through these symptoms. If too dizzy, slow head movement slightly. Rest between each exercise. 6.Exercises demand concentration; avoid distractions. 7.For safety, perform standing exercises close to a counter, wall, corner, or next to someone.  Copyright  VHI. All rights reserved.   Gaze Stabilization - Standing Feet Apart   Feet shoulder width apart, keeping eyes on target on wall 3 feet away, tilt head down slightly and move head side to side for 30 seconds. Repeat while moving head up and down for 30 seconds. *Work up to tolerating 60 seconds, as able. Do 3 sessions per day.  BEGIN GOING SLOW; try to keep dizziness and queasiness below a 4/10 in intensity (if higher, you can slow speed)  Copyright  VHI. All rights reserved.   Feet Apart (Compliant Surface) Varied Arm Positions - Eyes Closed    Stand on compliant surface: __pillow____ with feet shoulder width apart and arms out. Close eyes and visualize upright position. Hold__30__ seconds. Repeat __3__ times per session. Do __1-2__ sessions per day.  Copyright  VHI. All rights reserved.   Surface With Side to Side Head Motion    Perform without assistive device. Turn head and eyes left for __4__ steps. hen, turn head and eyes to opposite side for _4___ steps. Repeat sequence _10___ times per session. Do _1-2Turning in Place: Solid Surface    Standing in place, lead with head and turn slowly making half turns to the left.  Stop and let symptoms settle.  Turn half  turn to the right.   *Can use a visual target to spot to reduce symptoms. Repeat _5___ times per session. Do _1-2___ sessions per day.
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
## 2018-04-15 ENCOUNTER — Ambulatory Visit: Payer: Medicare Other | Admitting: Rehabilitative and Restorative Service Providers"

## 2018-04-15 ENCOUNTER — Encounter: Payer: Self-pay | Admitting: Rehabilitative and Restorative Service Providers"

## 2018-04-15 DIAGNOSIS — R42 Dizziness and giddiness: Secondary | ICD-10-CM | POA: Diagnosis not present

## 2018-04-15 DIAGNOSIS — R2689 Other abnormalities of gait and mobility: Secondary | ICD-10-CM | POA: Diagnosis not present
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
## 2018-04-15 NOTE — Patient Instructions (Addendum)
Gaze Stabilization - Tip Card  1.Target must remain in focus, not blurry, and appear stationary while head is in motion. 2.Perform exercises with small head movements (45 to either side of midline). 3.Increase speed of head motion so long as target is in focus. 4.If you wear eyeglasses, be sure you can see target through lens (therapist will give specific instructions for bifocal / progressive lenses). 5.These exercises may provoke dizziness or nausea. Work through these symptoms. If too dizzy, slow head movement slightly. Rest between each exercise. 6.Exercises demand concentration; avoid distractions. 7.For safety, perform standing exercises close to a counter, wall, corner, or next to someone.  Copyright  VHI. All rights reserved.  Gaze Stabilization - Standing Feet Apart   Feet shoulder width apart, keeping eyes on target on wall 3 feet away, tilt head down slightly and move head side to side for 30 seconds. Repeat while moving head up and down for 30 seconds. *Work up to tolerating 60 seconds, as able. Do 3 sessions per day.  BEGIN GOING SLOW; try to keep dizziness and queasiness below a 4/10 in intensity (if higher, you can slow speed)  Copyright  VHI. All rights reserved.  Feet Apart (Compliant Surface) Varied Arm Positions - Eyes Closed    Stand on compliant surface: __pillow____ with feet shoulder width apart and arms out. Close eyes and visualize upright position. Hold__30__ seconds. Repeat __3__ times per session. Do __1-2__ sessions per day.  Copyright  VHI. All rights reserved.   Surface With Side to Side Head Motion    Perform without assistive device. Turn head and eyes left for __4__ steps. hen, turn head and eyes to opposite side for _4___ steps. Repeat sequence _10___ times per session. Do _1-2Turning in Place: Solid Surface    Standing in place, lead with head and turn slowly making full turns to the left.  Stop and let symptoms settle. Spot a  target each time you complete 360 degree turn.  Repeat 3 times to each side.  Do _1-2___ sessions per day.

## 2018-04-24 ENCOUNTER — Encounter: Payer: Medicare Other | Admitting: Physical Therapy

## 2018-05-02 ENCOUNTER — Encounter: Payer: Self-pay | Admitting: Rehabilitative and Restorative Service Providers"

## 2018-05-02 ENCOUNTER — Other Ambulatory Visit: Payer: Self-pay | Admitting: Family Medicine

## 2018-05-02 ENCOUNTER — Ambulatory Visit: Payer: Medicare Other | Admitting: Rehabilitative and Restorative Service Providers"

## 2018-05-02 ENCOUNTER — Other Ambulatory Visit (HOSPITAL_COMMUNITY)
Admission: RE | Admit: 2018-05-02 | Discharge: 2018-05-02 | Disposition: A | Discharge status: Home / Self Care | Payer: Medicare Other | Source: Ambulatory Visit | Attending: Family Medicine | Admitting: Family Medicine

## 2018-05-02 DIAGNOSIS — F329 Major depressive disorder, single episode, unspecified: Secondary | ICD-10-CM | POA: Diagnosis not present

## 2018-05-02 DIAGNOSIS — G43909 Migraine, unspecified, not intractable, without status migrainosus: Secondary | ICD-10-CM | POA: Diagnosis not present

## 2018-05-02 DIAGNOSIS — H409 Unspecified glaucoma: Secondary | ICD-10-CM | POA: Diagnosis not present

## 2018-05-02 DIAGNOSIS — R2689 Other abnormalities of gait and mobility: Secondary | ICD-10-CM | POA: Diagnosis not present

## 2018-05-02 DIAGNOSIS — Z0000 Encounter for general adult medical examination without abnormal findings: Secondary | ICD-10-CM | POA: Diagnosis not present

## 2018-05-02 DIAGNOSIS — E871 Hypo-osmolality and hyponatremia: Secondary | ICD-10-CM | POA: Diagnosis not present

## 2018-05-02 DIAGNOSIS — Z8659 Personal history of other mental and behavioral disorders: Secondary | ICD-10-CM | POA: Diagnosis not present

## 2018-05-02 DIAGNOSIS — R42 Dizziness and giddiness: Secondary | ICD-10-CM

## 2018-05-02 DIAGNOSIS — Z124 Encounter for screening for malignant neoplasm of cervix: Secondary | ICD-10-CM | POA: Insufficient documentation

## 2018-05-02 DIAGNOSIS — R7989 Other specified abnormal findings of blood chemistry: Secondary | ICD-10-CM | POA: Diagnosis not present

## 2018-05-02 DIAGNOSIS — M8588 Other specified disorders of bone density and structure, other site: Secondary | ICD-10-CM | POA: Diagnosis not present

## 2018-05-02 NOTE — Patient Instructions (Signed)
Gaze Stabilization - Tip Card  1.Target must remain in focus, not blurry, and appear stationary while head is in motion. 2.Perform exercises with small head movements (45 to either side of midline). 3.Increase speed of head motion so long as target is in focus. 4.If you wear eyeglasses, be sure you can see target through lens (therapist will give specific instructions for bifocal / progressive lenses). 5.These exercises may provoke dizziness or nausea. Work through these symptoms. If too dizzy, slow head movement slightly. Rest between each exercise. 6.Exercises demand concentration; avoid distractions. 7.For safety, perform standing exercises close to a counter, wall, corner, or next to someone.  Copyright  VHI. All rights reserved.  Gaze Stabilization - Standing Feet Apart   Feet shoulder width apart, keeping eyes on target on wall 3 feet away, tilt head down slightly and move head side to side for 30 seconds. Repeat while moving head up and down for 30 seconds. *Work up to tolerating 60 seconds, as able. Do 3 sessions per day.  BEGIN GOING SLOW; try to keep dizziness and queasiness below a 4/10 in intensity (if higher, you can slow speed)   WAYS TO PROGRESS: You can progress to tolerating for up to one minute OR you can try to increase speed.  Move one variable at a time.   Copyright  VHI. All rights reserved.  Feet Apart (Compliant Surface) Varied Arm Positions - Eyes Closed    Stand on compliant surface: __pillow____ with feet shoulder width apart and arms out. Close eyes and visualize upright position. Hold__30__ seconds. Repeat __3__ times per session. Do __1-2__ sessions per day.  Copyright  VHI. All rights reserved.  WAYS TO PROGRESS: You can either use a higher density foam OR you can bring feet more narrow.      Turning in Place: Solid Surface    Standing in place, lead with head and turn slowly makingfullturns to the left. Stop and let symptoms  settle. Spot a target each time you complete 360 degree turn.  Repeat 3 times to each side.  Do _1-2___ sessions per day.           Repeat neck exercise to both sides, within tolerable range of motion.
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
## 2018-05-07 LAB — CYTOLOGY - PAP: Diagnosis: NEGATIVE

## 2018-05-16 DIAGNOSIS — H401132 Primary open-angle glaucoma, bilateral, moderate stage: Secondary | ICD-10-CM | POA: Diagnosis not present

## 2018-05-16 DIAGNOSIS — H401122 Primary open-angle glaucoma, left eye, moderate stage: Secondary | ICD-10-CM | POA: Diagnosis not present

## 2018-05-16 DIAGNOSIS — H401121 Primary open-angle glaucoma, left eye, mild stage: Secondary | ICD-10-CM | POA: Diagnosis not present

## 2018-05-17 DIAGNOSIS — Z23 Encounter for immunization: Secondary | ICD-10-CM | POA: Diagnosis not present

## 2018-05-27 DIAGNOSIS — H401221 Low-tension glaucoma, left eye, mild stage: Secondary | ICD-10-CM | POA: Diagnosis not present

## 2018-06-13 DIAGNOSIS — M8588 Other specified disorders of bone density and structure, other site: Secondary | ICD-10-CM | POA: Diagnosis not present

## 2018-10-17 DIAGNOSIS — H401121 Primary open-angle glaucoma, left eye, mild stage: Secondary | ICD-10-CM | POA: Diagnosis not present

## 2018-11-01 ENCOUNTER — Other Ambulatory Visit: Payer: Self-pay | Admitting: Family Medicine

## 2018-11-01 DIAGNOSIS — Z1231 Encounter for screening mammogram for malignant neoplasm of breast: Secondary | ICD-10-CM

## 2018-12-17 ENCOUNTER — Ambulatory Visit: Payer: Medicare Other

## 2019-01-30 ENCOUNTER — Ambulatory Visit: Payer: Medicare Other

## 2019-02-17 DIAGNOSIS — H401112 Primary open-angle glaucoma, right eye, moderate stage: Secondary | ICD-10-CM | POA: Diagnosis not present

## 2019-02-22 ENCOUNTER — Other Ambulatory Visit: Payer: Self-pay

## 2019-02-22 ENCOUNTER — Ambulatory Visit
Admission: RE | Admit: 2019-02-22 | Discharge: 2019-02-22 | Disposition: A | Discharge status: Home / Self Care | Payer: Medicare Other | Source: Ambulatory Visit | Attending: Family Medicine | Admitting: Family Medicine

## 2019-02-22 DIAGNOSIS — Z1231 Encounter for screening mammogram for malignant neoplasm of breast: Secondary | ICD-10-CM | POA: Diagnosis not present

## 2019-05-13 DIAGNOSIS — Z23 Encounter for immunization: Secondary | ICD-10-CM | POA: Diagnosis not present

## 2019-07-24 DIAGNOSIS — H401121 Primary open-angle glaucoma, left eye, mild stage: Secondary | ICD-10-CM | POA: Diagnosis not present

## 2019-09-26 ENCOUNTER — Ambulatory Visit: Payer: Medicare Other | Attending: Internal Medicine

## 2019-09-26 DIAGNOSIS — Z23 Encounter for immunization: Secondary | ICD-10-CM | POA: Insufficient documentation

## 2019-09-26 NOTE — Progress Notes (Signed)
   Covid-19 Vaccination Clinic  Name:  Tina Wagner    MRN: [id number] DOB: [birth year]  09/26/2019  Tina Wagner was observed post Covid-19 immunization for 15 minutes without incidence. She was provided with Vaccine Information Sheet and instruction to access the V-Safe system.   Tina Wagner was instructed to call 911 with any severe reactions post vaccine: Marland Kitchen Difficulty breathing  . Swelling of your face and throat  . A fast heartbeat  . A bad rash all over your body  . Dizziness and weakness    Immunizations Administered    Name Date Dose VIS Date Route   Pfizer COVID-19 Vaccine 09/26/2019  2:20 PM 0.3 mL 08/15/2019 Intramuscular   Manufacturer: [place]   Lot: [id number]   [place]: [id number]

## 2019-10-17 ENCOUNTER — Ambulatory Visit: Payer: Medicare Other | Attending: Internal Medicine

## 2019-10-17 DIAGNOSIS — Z23 Encounter for immunization: Secondary | ICD-10-CM

## 2019-10-17 NOTE — Progress Notes (Signed)
   Covid-19 Vaccination Clinic  Name:  BIBI SISE    MRN: [id number] DOB: 1945/10/20  10/17/2019  Ms. Nodal was observed post Covid-19 immunization for 15 minutes without incidence. She was provided with Vaccine Information Sheet and instruction to access the V-Safe system.   Ms. Hanlan was instructed to call 911 with any severe reactions post vaccine: Marland Kitchen Difficulty breathing  . Swelling of your face and throat  . A fast heartbeat  . A bad rash all over your body  . Dizziness and weakness    Immunizations Administered    Name Date Dose VIS Date Route   Pfizer COVID-19 Vaccine 10/17/2019  1:44 PM 0.3 mL 08/15/2019 Intramuscular   Manufacturer: [place]   Lot: [id number]   [place]: [id number]

## 2019-12-26 DIAGNOSIS — H401121 Primary open-angle glaucoma, left eye, mild stage: Secondary | ICD-10-CM | POA: Diagnosis not present

## 2019-12-30 DIAGNOSIS — M19049 Primary osteoarthritis, unspecified hand: Secondary | ICD-10-CM | POA: Diagnosis not present

## 2019-12-30 DIAGNOSIS — R7989 Other specified abnormal findings of blood chemistry: Secondary | ICD-10-CM | POA: Diagnosis not present

## 2019-12-30 DIAGNOSIS — G43909 Migraine, unspecified, not intractable, without status migrainosus: Secondary | ICD-10-CM | POA: Diagnosis not present

## 2019-12-30 DIAGNOSIS — Z1159 Encounter for screening for other viral diseases: Secondary | ICD-10-CM | POA: Diagnosis not present

## 2019-12-30 DIAGNOSIS — Z0000 Encounter for general adult medical examination without abnormal findings: Secondary | ICD-10-CM | POA: Diagnosis not present

## 2019-12-30 DIAGNOSIS — Z131 Encounter for screening for diabetes mellitus: Secondary | ICD-10-CM | POA: Diagnosis not present

## 2019-12-30 DIAGNOSIS — M8588 Other specified disorders of bone density and structure, other site: Secondary | ICD-10-CM | POA: Diagnosis not present

## 2019-12-30 DIAGNOSIS — Z136 Encounter for screening for cardiovascular disorders: Secondary | ICD-10-CM | POA: Diagnosis not present

## 2020-04-13 ENCOUNTER — Other Ambulatory Visit: Payer: Self-pay | Admitting: Family Medicine

## 2020-04-13 DIAGNOSIS — Z1231 Encounter for screening mammogram for malignant neoplasm of breast: Secondary | ICD-10-CM

## 2020-04-22 ENCOUNTER — Other Ambulatory Visit: Payer: Self-pay

## 2020-04-22 ENCOUNTER — Ambulatory Visit
Admission: RE | Admit: 2020-04-22 | Discharge: 2020-04-22 | Disposition: A | Discharge status: Home / Self Care | Payer: Medicare Other | Source: Ambulatory Visit

## 2020-04-22 DIAGNOSIS — Z1231 Encounter for screening mammogram for malignant neoplasm of breast: Secondary | ICD-10-CM

## 2020-04-29 DIAGNOSIS — H401132 Primary open-angle glaucoma, bilateral, moderate stage: Secondary | ICD-10-CM | POA: Diagnosis not present

## 2020-05-06 DIAGNOSIS — Z23 Encounter for immunization: Secondary | ICD-10-CM | POA: Diagnosis not present

## 2020-06-02 DIAGNOSIS — Z23 Encounter for immunization: Secondary | ICD-10-CM | POA: Diagnosis not present

## 2020-08-19 DIAGNOSIS — H401132 Primary open-angle glaucoma, bilateral, moderate stage: Secondary | ICD-10-CM | POA: Diagnosis not present

## 2020-08-24 DIAGNOSIS — R002 Palpitations: Secondary | ICD-10-CM | POA: Diagnosis not present

## 2020-08-25 ENCOUNTER — Telehealth: Payer: Self-pay

## 2020-08-25 NOTE — Telephone Encounter (Signed)
EKG ON FILE RJ 

## 2020-10-12 DIAGNOSIS — M542 Cervicalgia: Secondary | ICD-10-CM | POA: Diagnosis not present

## 2020-10-12 DIAGNOSIS — M9901 Segmental and somatic dysfunction of cervical region: Secondary | ICD-10-CM | POA: Diagnosis not present

## 2020-10-12 DIAGNOSIS — M9903 Segmental and somatic dysfunction of lumbar region: Secondary | ICD-10-CM | POA: Diagnosis not present

## 2020-10-12 DIAGNOSIS — M9902 Segmental and somatic dysfunction of thoracic region: Secondary | ICD-10-CM | POA: Diagnosis not present

## 2020-10-14 DIAGNOSIS — M9901 Segmental and somatic dysfunction of cervical region: Secondary | ICD-10-CM | POA: Diagnosis not present

## 2020-10-14 DIAGNOSIS — M9902 Segmental and somatic dysfunction of thoracic region: Secondary | ICD-10-CM | POA: Diagnosis not present

## 2020-10-14 DIAGNOSIS — M542 Cervicalgia: Secondary | ICD-10-CM | POA: Diagnosis not present

## 2020-10-14 DIAGNOSIS — M9903 Segmental and somatic dysfunction of lumbar region: Secondary | ICD-10-CM | POA: Diagnosis not present

## 2020-10-15 DIAGNOSIS — M9903 Segmental and somatic dysfunction of lumbar region: Secondary | ICD-10-CM | POA: Diagnosis not present

## 2020-10-15 DIAGNOSIS — M9901 Segmental and somatic dysfunction of cervical region: Secondary | ICD-10-CM | POA: Diagnosis not present

## 2020-10-15 DIAGNOSIS — M542 Cervicalgia: Secondary | ICD-10-CM | POA: Diagnosis not present

## 2020-10-15 DIAGNOSIS — M9902 Segmental and somatic dysfunction of thoracic region: Secondary | ICD-10-CM | POA: Diagnosis not present

## 2020-10-19 DIAGNOSIS — M542 Cervicalgia: Secondary | ICD-10-CM | POA: Diagnosis not present

## 2020-10-19 DIAGNOSIS — M9901 Segmental and somatic dysfunction of cervical region: Secondary | ICD-10-CM | POA: Diagnosis not present

## 2020-10-19 DIAGNOSIS — M9902 Segmental and somatic dysfunction of thoracic region: Secondary | ICD-10-CM | POA: Diagnosis not present

## 2020-10-19 DIAGNOSIS — M9903 Segmental and somatic dysfunction of lumbar region: Secondary | ICD-10-CM | POA: Diagnosis not present

## 2020-10-20 DIAGNOSIS — M542 Cervicalgia: Secondary | ICD-10-CM | POA: Diagnosis not present

## 2020-10-20 DIAGNOSIS — M9902 Segmental and somatic dysfunction of thoracic region: Secondary | ICD-10-CM | POA: Diagnosis not present

## 2020-10-20 DIAGNOSIS — M9901 Segmental and somatic dysfunction of cervical region: Secondary | ICD-10-CM | POA: Diagnosis not present

## 2020-10-20 DIAGNOSIS — M9903 Segmental and somatic dysfunction of lumbar region: Secondary | ICD-10-CM | POA: Diagnosis not present

## 2020-10-22 DIAGNOSIS — M542 Cervicalgia: Secondary | ICD-10-CM | POA: Diagnosis not present

## 2020-10-22 DIAGNOSIS — M9902 Segmental and somatic dysfunction of thoracic region: Secondary | ICD-10-CM | POA: Diagnosis not present

## 2020-10-22 DIAGNOSIS — M9903 Segmental and somatic dysfunction of lumbar region: Secondary | ICD-10-CM | POA: Diagnosis not present

## 2020-10-22 DIAGNOSIS — M9901 Segmental and somatic dysfunction of cervical region: Secondary | ICD-10-CM | POA: Diagnosis not present

## 2020-10-26 DIAGNOSIS — M9901 Segmental and somatic dysfunction of cervical region: Secondary | ICD-10-CM | POA: Diagnosis not present

## 2020-10-26 DIAGNOSIS — M542 Cervicalgia: Secondary | ICD-10-CM | POA: Diagnosis not present

## 2020-10-26 DIAGNOSIS — M9903 Segmental and somatic dysfunction of lumbar region: Secondary | ICD-10-CM | POA: Diagnosis not present

## 2020-10-26 DIAGNOSIS — M9902 Segmental and somatic dysfunction of thoracic region: Secondary | ICD-10-CM | POA: Diagnosis not present

## 2020-10-27 DIAGNOSIS — M542 Cervicalgia: Secondary | ICD-10-CM | POA: Diagnosis not present

## 2020-10-27 DIAGNOSIS — M9901 Segmental and somatic dysfunction of cervical region: Secondary | ICD-10-CM | POA: Diagnosis not present

## 2020-10-27 DIAGNOSIS — M9902 Segmental and somatic dysfunction of thoracic region: Secondary | ICD-10-CM | POA: Diagnosis not present

## 2020-10-27 DIAGNOSIS — M9903 Segmental and somatic dysfunction of lumbar region: Secondary | ICD-10-CM | POA: Diagnosis not present

## 2020-11-05 DIAGNOSIS — M542 Cervicalgia: Secondary | ICD-10-CM | POA: Diagnosis not present

## 2020-11-05 DIAGNOSIS — M9901 Segmental and somatic dysfunction of cervical region: Secondary | ICD-10-CM | POA: Diagnosis not present

## 2020-11-05 DIAGNOSIS — M9903 Segmental and somatic dysfunction of lumbar region: Secondary | ICD-10-CM | POA: Diagnosis not present

## 2020-11-05 DIAGNOSIS — M9902 Segmental and somatic dysfunction of thoracic region: Secondary | ICD-10-CM | POA: Diagnosis not present

## 2020-11-09 DIAGNOSIS — M9903 Segmental and somatic dysfunction of lumbar region: Secondary | ICD-10-CM | POA: Diagnosis not present

## 2020-11-09 DIAGNOSIS — M9901 Segmental and somatic dysfunction of cervical region: Secondary | ICD-10-CM | POA: Diagnosis not present

## 2020-11-09 DIAGNOSIS — M542 Cervicalgia: Secondary | ICD-10-CM | POA: Diagnosis not present

## 2020-11-09 DIAGNOSIS — M9902 Segmental and somatic dysfunction of thoracic region: Secondary | ICD-10-CM | POA: Diagnosis not present

## 2020-11-11 DIAGNOSIS — M542 Cervicalgia: Secondary | ICD-10-CM | POA: Diagnosis not present

## 2020-11-11 DIAGNOSIS — M9901 Segmental and somatic dysfunction of cervical region: Secondary | ICD-10-CM | POA: Diagnosis not present

## 2020-11-11 DIAGNOSIS — M9902 Segmental and somatic dysfunction of thoracic region: Secondary | ICD-10-CM | POA: Diagnosis not present

## 2020-11-11 DIAGNOSIS — M9903 Segmental and somatic dysfunction of lumbar region: Secondary | ICD-10-CM | POA: Diagnosis not present

## 2020-11-12 DIAGNOSIS — M9901 Segmental and somatic dysfunction of cervical region: Secondary | ICD-10-CM | POA: Diagnosis not present

## 2020-11-12 DIAGNOSIS — M542 Cervicalgia: Secondary | ICD-10-CM | POA: Diagnosis not present

## 2020-11-12 DIAGNOSIS — M9903 Segmental and somatic dysfunction of lumbar region: Secondary | ICD-10-CM | POA: Diagnosis not present

## 2020-11-12 DIAGNOSIS — M9902 Segmental and somatic dysfunction of thoracic region: Secondary | ICD-10-CM | POA: Diagnosis not present

## 2020-11-16 DIAGNOSIS — M9903 Segmental and somatic dysfunction of lumbar region: Secondary | ICD-10-CM | POA: Diagnosis not present

## 2020-11-16 DIAGNOSIS — M542 Cervicalgia: Secondary | ICD-10-CM | POA: Diagnosis not present

## 2020-11-16 DIAGNOSIS — M9901 Segmental and somatic dysfunction of cervical region: Secondary | ICD-10-CM | POA: Diagnosis not present

## 2020-11-16 DIAGNOSIS — M9902 Segmental and somatic dysfunction of thoracic region: Secondary | ICD-10-CM | POA: Diagnosis not present

## 2020-11-17 ENCOUNTER — Ambulatory Visit (INDEPENDENT_AMBULATORY_CARE_PROVIDER_SITE_OTHER): Payer: Medicare Other | Admitting: Cardiovascular Disease

## 2020-11-17 ENCOUNTER — Encounter: Payer: Self-pay | Admitting: Cardiovascular Disease

## 2020-11-17 ENCOUNTER — Other Ambulatory Visit: Payer: Self-pay

## 2020-11-17 VITALS — BP 132/80 | HR 65 | Ht 64.25 in | Wt 148.0 lb

## 2020-11-17 DIAGNOSIS — R002 Palpitations: Secondary | ICD-10-CM | POA: Diagnosis not present

## 2020-11-17 DIAGNOSIS — R0789 Other chest pain: Secondary | ICD-10-CM | POA: Diagnosis not present

## 2020-11-17 DIAGNOSIS — R079 Chest pain, unspecified: Secondary | ICD-10-CM

## 2020-11-17 NOTE — Progress Notes (Signed)
Cardiology Office Note  Date:  11/17/2020   ID:  NUSAYBAH IVIE, DOB 12/26/1945, MRN [id number]  PCP:  Leighton Ruff, MD  Cardiologist:   Skeet Latch, MD   No chief complaint on file.  History of Present Illness: Tina Wagner is a 75 y.o. female with depression who presents for follow up on palpitations. Tina Wagner saw her PCP, Tina Grove, Tina Wagner, on 3/16.   At that appointment she reported episodes of heart pounding, left-sided chest pain, and presyncope.  She was noted to be bradycardic, though her heart rate at the time was recorded at 72 bpm.  She was referred to cardiology for evaluation.  At that appointment she also reported episodes of atypical chest pain and a family history of CAD. She wore a 7 day Event monitor that revealed no arrhythmias.  She also had a treadmill stress test that was negative for ischemia.  She exercised for 7 minutes on a Bruce protocol (9.2 METS).  One day while wearing the monitor she had an episode of palpitations that caused her to pull her car over.  She was unsure of whether the event was captured because she held the button for a long period of time.    Tina Wagner saw Dr. Drema Dallas on 08/2020 and reported recurrent palpitations.  TSH was 2.89 at that visit.  Blood counts were unremarkable.  She was referred back to cardiology for repeat evaluation.  In the fall she noted intermittent palpitations. It was more common when she laid down or when sitting in a chair.  The episodes lasted 1-2 minutes and were not associated with shortness of breath or lightheadedness.  It wasn't fast or irregular.  She felt as though her heart was flipping around in her chest at times and other times she has felt that her heartbeat was more pronounced than usual.  She has no exertional symptoms.  She goes to the [facility] at least twice per week.  She has no chest pain and her breathing has been stable.  She notices that she is slowing down some. She has more difficulty using resistance  bands and feels like her muscle mass has decreased.   Since January she hasn't noticed it anymore. She hasn't noticed any LE edema orthopnea or pND.  She sometimes has cramps in her legs that wake her up.  She has no claudication.  She drinks 1 coffee daily and occasionally has a tea in the afternoon.  She notes that she has been under more stress lately.  Her daughter is a single mom of 2 children and she is trying to help her more with the kids.  When her daughter stressed she feels more stressed.  She is also been stressed about the social/political climate as well as the war in [place].  Past Medical History:  Diagnosis Date  . Atypical chest pain 12/09/2015  . Palpitations 12/09/2015    No past surgical history on file.   Current Outpatient Medications  Medication Sig Dispense Refill  . Calcium Carb-Cholecalciferol 600-200 MG-UNIT TABS 2 tablet with food    . dorzolamide-timolol (COSOPT) 22.3-6.8 MG/ML ophthalmic solution Place 1 drop into both eyes 2 (two) times daily.  5  . meclizine (ANTIVERT) 12.5 MG tablet 1 tablet    . Multiple Vitamins-Minerals (MULTIVITAMIN PO) Take 1 tablet by mouth daily.    . TRAVATAN Z 0.004 % SOLN ophthalmic solution Place 1 drop into both eyes at bedtime.  4   No current facility-administered medications  for this visit.    Allergies:   Celexa [citalopram]    Social History:  The patient  reports that she has quit smoking. She has never used smokeless tobacco.   Family History:  The patient's family history includes COPD in her mother; Heart disease in her father and maternal grandfather; Lung disease in her maternal grandmother; Osteoporosis in her mother.    ROS:  Please see the history of present illness.   Otherwise, review of systems are positive for orthostasis.   All other systems are reviewed and negative.    PHYSICAL EXAM: VS:  BP 132/80 (BP Location: Right Arm, Patient Position: Sitting, Cuff Size: Normal)   Pulse 65   Ht 5' 4.25" (1.632 m)    Wt 148 lb (67.1 kg)   SpO2 98%   BMI 25.21 kg/m  , BMI Body mass index is 25.21 kg/m. GENERAL:  Well appearing HEENT:  Pupils equal round and reactive, fundi not visualized, oral mucosa unremarkable NECK:  No jugular venous distention, waveform within normal limits, carotid upstroke brisk and symmetric, no bruits LUNGS:  Clear to auscultation bilaterally HEART:  RRR.  PMI not displaced or sustained,S1 and S2 within normal limits, no S3, no S4, no clicks, no rubs, no murmurs ABD:  Flat, positive bowel sounds normal in frequency in pitch, no bruits, no rebound, no guarding, no midline pulsatile mass, no hepatomegaly, no splenomegaly EXT:  2 plus pulses throughout, no edema, no cyanosis no clubbing SKIN:  No rashes no nodules NEURO:  Cranial nerves II through XII grossly intact, motor grossly intact throughout PSYCH:  Cognitively intact, oriented to person place and time   EKG:  EKG is ordered today. The ekg ordered 12/09/15 demonstrates sinus rhythm. Rate 63 bpm. 11/17/2020: Sinus rhythm.  Rate 65 bpm. 7 Day Event Monitor 12/09/15:  Quality: Fair. Baseline artifact. Predominant rhythm: sinus  Average heart rate: 72 bpm Max heart rate: 141 bpm Min heart rate: 51 bpm  No arrhythmias noted.  ETT 12/31/15:  There was no ST segment deviation noted during stress.  No T wave inversion was noted during stress.  Recent Labs: No results found for requested labs within last 8760 hours.   08/24/2020: WBC 5.3, hemoglobin 13.1, hematocrit 38.4, platelets 308 Sodium 136, potassium 4.4, BUN 13, creatinine 0.67 AST 19, ALT 18 TSH 2.89   Lipid Panel No results found for: CHOL, TRIG, HDL, CHOLHDL, VLDL, LDLCALC, LDLDIRECT    Wt Readings from Last 3 Encounters:  11/17/20 148 lb (67.1 kg)  01/27/16 138 lb 12.8 oz (63 kg)  12/09/15 140 lb 12.8 oz (63.9 kg)      ASSESSMENT AND PLAN:  # Palpitations: No arrhythmias were noted on Holter monitor in 2017 1.  I suspect that this is more  stress related.  She has no exertional symptoms and only occurs when at rest.  Her heart is not fast or irregular, it is just more noticeable.  Given that she is had negative work-ups in the past and has no alarm symptoms, we will not pursue additional testing at this time.  We did discuss beta-blockers, but she agrees that this is more like coverage of the problem and fixing it.  She is going to consider getting a therapist.  # Chest pain: # Exercise intolerance:  She notes that she is slowing down has more difficulty doing exercise than she used to in the past.  She does not have chest pain but does have more shortness of breath.  We will get  an exercise Myoview to rule out ischemia.  Current medicines are reviewed at length with the patient today.  The patient does not have concerns regarding medicines.  The following changes have been made:  no change  Labs/ tests ordered today include:   Orders Placed This Encounter  Procedures  . MYOCARDIAL PERFUSION IMAGING  . EKG 12-Lead     Disposition:   FU with Taffy Delconte C. Oval Linsey, MD, [facility] as needed.   This note was written with the assistance of speech recognition software.  Please excuse any transcriptional errors.  Signed, Jamichael Knotts C. Oval Linsey, MD, [facility]  11/17/2020 4:40 PM    [facility]

## 2020-11-17 NOTE — Patient Instructions (Addendum)
Medication Instructions:  Your physician recommends that you continue on your current medications as directed. Please refer to the Current Medication list given to you today.   Labwork: COVID SCREENING 3 DAY PRIOR TO STRESS TEST    Testing/Procedures: Your physician has requested that you have en exercise stress myoview. For further information please visit [email or web address]. Please follow instruction sheet, as given.  Follow-Up: AS NEEDED   Explanation: Th fact that your episodes occur mostly at rest tell us that it is unlikely that this is coming from your heart.  I suspect that your heart is feeling the stress of all that is going on in your life right now.  Your heart monitor before showed that there were no dangerous arrhythmias, and I suspect this is still the same.  The fact that you are able to exercise regularly and not have chest pain or shortness of breath is reassuring.  However, given the fact that you are not able to do as much as he used to in the past we will get a stress test just to make sure.

## 2020-11-19 DIAGNOSIS — M9903 Segmental and somatic dysfunction of lumbar region: Secondary | ICD-10-CM | POA: Diagnosis not present

## 2020-11-19 DIAGNOSIS — M542 Cervicalgia: Secondary | ICD-10-CM | POA: Diagnosis not present

## 2020-11-19 DIAGNOSIS — M9902 Segmental and somatic dysfunction of thoracic region: Secondary | ICD-10-CM | POA: Diagnosis not present

## 2020-11-19 DIAGNOSIS — M9901 Segmental and somatic dysfunction of cervical region: Secondary | ICD-10-CM | POA: Diagnosis not present

## 2020-11-22 ENCOUNTER — Other Ambulatory Visit (HOSPITAL_COMMUNITY)
Admission: RE | Admit: 2020-11-22 | Discharge: 2020-11-22 | Disposition: A | Discharge status: Home / Self Care | Payer: Medicare Other | Source: Ambulatory Visit | Attending: Cardiovascular Disease | Admitting: Cardiovascular Disease

## 2020-11-22 DIAGNOSIS — Z20822 Contact with and (suspected) exposure to covid-19: Secondary | ICD-10-CM | POA: Diagnosis not present

## 2020-11-22 DIAGNOSIS — Z01812 Encounter for preprocedural laboratory examination: Secondary | ICD-10-CM | POA: Diagnosis not present

## 2020-11-23 DIAGNOSIS — M542 Cervicalgia: Secondary | ICD-10-CM | POA: Diagnosis not present

## 2020-11-23 DIAGNOSIS — M9902 Segmental and somatic dysfunction of thoracic region: Secondary | ICD-10-CM | POA: Diagnosis not present

## 2020-11-23 DIAGNOSIS — M9903 Segmental and somatic dysfunction of lumbar region: Secondary | ICD-10-CM | POA: Diagnosis not present

## 2020-11-23 DIAGNOSIS — M9901 Segmental and somatic dysfunction of cervical region: Secondary | ICD-10-CM | POA: Diagnosis not present

## 2020-11-23 LAB — SARS CORONAVIRUS 2 (TAT 6-24 HRS): SARS Coronavirus 2: NEGATIVE

## 2020-11-24 ENCOUNTER — Telehealth (HOSPITAL_COMMUNITY): Payer: Self-pay | Admitting: *Deleted

## 2020-11-24 NOTE — Telephone Encounter (Signed)
Close encounter 

## 2020-11-25 ENCOUNTER — Ambulatory Visit (HOSPITAL_COMMUNITY)
Admission: RE | Admit: 2020-11-25 | Discharge: 2020-11-25 | Disposition: A | Discharge status: Home / Self Care | Payer: Medicare Other | Source: Ambulatory Visit | Attending: Cardiovascular Disease | Admitting: Cardiovascular Disease

## 2020-11-25 ENCOUNTER — Other Ambulatory Visit: Payer: Self-pay

## 2020-11-25 DIAGNOSIS — R079 Chest pain, unspecified: Secondary | ICD-10-CM

## 2020-11-25 LAB — MYOCARDIAL PERFUSION IMAGING
Estimated workload: 7 METS
Exercise duration (min): 5 min
Exercise duration (sec): 46 s
LV dias vol: 14 mL (ref 46–106)
LV sys vol: 66 mL
MPHR: 148 {beats}/min
Peak HR: 131 {beats}/min
Percent HR: 88 %
Rest HR: 58 {beats}/min
SDS: 0
SRS: 1
SSS: 1
TID: 0.8

## 2020-11-25 MED ORDER — TECHNETIUM TC 99M TETROFOSMIN IV KIT
29.5000 | PACK | Freq: Once | INTRAVENOUS | Status: AC | PRN
  Administered 2020-11-25: 29.5 via INTRAVENOUS
  Filled 2020-11-25: qty 30

## 2020-11-25 MED ORDER — TECHNETIUM TC 99M TETROFOSMIN IV KIT
9.4000 | PACK | Freq: Once | INTRAVENOUS | Status: AC | PRN
  Administered 2020-11-25: 9.4 via INTRAVENOUS
  Filled 2020-11-25: qty 10

## 2020-12-07 DIAGNOSIS — F329 Major depressive disorder, single episode, unspecified: Secondary | ICD-10-CM | POA: Diagnosis not present

## 2020-12-16 DIAGNOSIS — H2513 Age-related nuclear cataract, bilateral: Secondary | ICD-10-CM | POA: Diagnosis not present

## 2020-12-21 DIAGNOSIS — F329 Major depressive disorder, single episode, unspecified: Secondary | ICD-10-CM | POA: Diagnosis not present

## 2021-01-04 DIAGNOSIS — G5602 Carpal tunnel syndrome, left upper limb: Secondary | ICD-10-CM | POA: Diagnosis not present

## 2021-01-04 DIAGNOSIS — Z23 Encounter for immunization: Secondary | ICD-10-CM | POA: Diagnosis not present

## 2021-01-04 DIAGNOSIS — Z0000 Encounter for general adult medical examination without abnormal findings: Secondary | ICD-10-CM | POA: Diagnosis not present

## 2021-01-04 DIAGNOSIS — Z1389 Encounter for screening for other disorder: Secondary | ICD-10-CM | POA: Diagnosis not present

## 2021-01-05 ENCOUNTER — Other Ambulatory Visit: Payer: Self-pay | Admitting: Family Medicine

## 2021-01-05 DIAGNOSIS — Z1231 Encounter for screening mammogram for malignant neoplasm of breast: Secondary | ICD-10-CM

## 2021-01-05 DIAGNOSIS — M8588 Other specified disorders of bone density and structure, other site: Secondary | ICD-10-CM

## 2021-01-05 DIAGNOSIS — Z01818 Encounter for other preprocedural examination: Secondary | ICD-10-CM | POA: Diagnosis not present

## 2021-01-05 DIAGNOSIS — H401132 Primary open-angle glaucoma, bilateral, moderate stage: Secondary | ICD-10-CM | POA: Diagnosis not present

## 2021-01-05 DIAGNOSIS — H2512 Age-related nuclear cataract, left eye: Secondary | ICD-10-CM | POA: Diagnosis not present

## 2021-01-12 DIAGNOSIS — F329 Major depressive disorder, single episode, unspecified: Secondary | ICD-10-CM | POA: Diagnosis not present

## 2021-01-13 DIAGNOSIS — Z23 Encounter for immunization: Secondary | ICD-10-CM | POA: Diagnosis not present

## 2021-01-21 DIAGNOSIS — G5602 Carpal tunnel syndrome, left upper limb: Secondary | ICD-10-CM | POA: Diagnosis not present

## 2021-02-15 DIAGNOSIS — F329 Major depressive disorder, single episode, unspecified: Secondary | ICD-10-CM | POA: Diagnosis not present

## 2021-03-11 DIAGNOSIS — H401122 Primary open-angle glaucoma, left eye, moderate stage: Secondary | ICD-10-CM | POA: Diagnosis not present

## 2021-03-11 DIAGNOSIS — H2512 Age-related nuclear cataract, left eye: Secondary | ICD-10-CM | POA: Diagnosis not present

## 2021-03-11 DIAGNOSIS — H409 Unspecified glaucoma: Secondary | ICD-10-CM | POA: Diagnosis not present

## 2021-03-11 DIAGNOSIS — H401132 Primary open-angle glaucoma, bilateral, moderate stage: Secondary | ICD-10-CM | POA: Diagnosis not present

## 2021-03-29 DIAGNOSIS — Z13220 Encounter for screening for lipoid disorders: Secondary | ICD-10-CM | POA: Diagnosis not present

## 2021-03-29 DIAGNOSIS — M8588 Other specified disorders of bone density and structure, other site: Secondary | ICD-10-CM | POA: Diagnosis not present

## 2021-03-29 DIAGNOSIS — Z23 Encounter for immunization: Secondary | ICD-10-CM | POA: Diagnosis not present

## 2021-03-29 DIAGNOSIS — Z131 Encounter for screening for diabetes mellitus: Secondary | ICD-10-CM | POA: Diagnosis not present

## 2021-03-29 DIAGNOSIS — R718 Other abnormality of red blood cells: Secondary | ICD-10-CM | POA: Diagnosis not present

## 2021-03-29 DIAGNOSIS — G43909 Migraine, unspecified, not intractable, without status migrainosus: Secondary | ICD-10-CM | POA: Diagnosis not present

## 2021-03-29 DIAGNOSIS — R002 Palpitations: Secondary | ICD-10-CM | POA: Diagnosis not present

## 2021-03-29 DIAGNOSIS — M19049 Primary osteoarthritis, unspecified hand: Secondary | ICD-10-CM | POA: Diagnosis not present

## 2021-03-29 DIAGNOSIS — Z136 Encounter for screening for cardiovascular disorders: Secondary | ICD-10-CM | POA: Diagnosis not present

## 2021-03-29 DIAGNOSIS — D709 Neutropenia, unspecified: Secondary | ICD-10-CM | POA: Diagnosis not present

## 2021-04-22 DIAGNOSIS — H401132 Primary open-angle glaucoma, bilateral, moderate stage: Secondary | ICD-10-CM | POA: Diagnosis not present

## 2021-04-22 DIAGNOSIS — H401112 Primary open-angle glaucoma, right eye, moderate stage: Secondary | ICD-10-CM | POA: Diagnosis not present

## 2021-04-22 DIAGNOSIS — H2511 Age-related nuclear cataract, right eye: Secondary | ICD-10-CM | POA: Diagnosis not present

## 2021-04-22 DIAGNOSIS — H401131 Primary open-angle glaucoma, bilateral, mild stage: Secondary | ICD-10-CM | POA: Diagnosis not present

## 2021-04-22 DIAGNOSIS — H409 Unspecified glaucoma: Secondary | ICD-10-CM | POA: Diagnosis not present

## 2021-04-28 DIAGNOSIS — L821 Other seborrheic keratosis: Secondary | ICD-10-CM | POA: Diagnosis not present

## 2021-04-28 DIAGNOSIS — L570 Actinic keratosis: Secondary | ICD-10-CM | POA: Diagnosis not present

## 2021-04-28 DIAGNOSIS — L814 Other melanin hyperpigmentation: Secondary | ICD-10-CM | POA: Diagnosis not present

## 2021-04-28 DIAGNOSIS — L720 Epidermal cyst: Secondary | ICD-10-CM | POA: Diagnosis not present

## 2021-04-28 DIAGNOSIS — D225 Melanocytic nevi of trunk: Secondary | ICD-10-CM | POA: Diagnosis not present

## 2021-04-28 DIAGNOSIS — L578 Other skin changes due to chronic exposure to nonionizing radiation: Secondary | ICD-10-CM | POA: Diagnosis not present

## 2021-05-10 DIAGNOSIS — G5602 Carpal tunnel syndrome, left upper limb: Secondary | ICD-10-CM | POA: Diagnosis not present

## 2021-05-30 DIAGNOSIS — Z23 Encounter for immunization: Secondary | ICD-10-CM | POA: Diagnosis not present

## 2021-06-15 DIAGNOSIS — H401121 Primary open-angle glaucoma, left eye, mild stage: Secondary | ICD-10-CM | POA: Diagnosis not present

## 2021-06-24 ENCOUNTER — Ambulatory Visit
Admission: RE | Admit: 2021-06-24 | Discharge: 2021-06-24 | Disposition: A | Discharge status: Home / Self Care | Payer: Medicare Other | Source: Ambulatory Visit | Attending: Family Medicine | Admitting: Family Medicine

## 2021-06-24 ENCOUNTER — Other Ambulatory Visit: Payer: Self-pay | Admitting: Family Medicine

## 2021-06-24 ENCOUNTER — Other Ambulatory Visit: Payer: Self-pay

## 2021-06-24 ENCOUNTER — Other Ambulatory Visit: Payer: Medicare Other

## 2021-06-24 DIAGNOSIS — Z1231 Encounter for screening mammogram for malignant neoplasm of breast: Secondary | ICD-10-CM | POA: Diagnosis not present

## 2021-08-09 DIAGNOSIS — E871 Hypo-osmolality and hyponatremia: Secondary | ICD-10-CM | POA: Diagnosis not present

## 2021-08-09 DIAGNOSIS — H9190 Unspecified hearing loss, unspecified ear: Secondary | ICD-10-CM | POA: Diagnosis not present

## 2021-08-22 DIAGNOSIS — H9313 Tinnitus, bilateral: Secondary | ICD-10-CM | POA: Diagnosis not present

## 2021-08-22 DIAGNOSIS — H903 Sensorineural hearing loss, bilateral: Secondary | ICD-10-CM | POA: Diagnosis not present

## 2021-08-24 DIAGNOSIS — H838X3 Other specified diseases of inner ear, bilateral: Secondary | ICD-10-CM | POA: Diagnosis not present

## 2021-08-24 DIAGNOSIS — H9313 Tinnitus, bilateral: Secondary | ICD-10-CM | POA: Diagnosis not present

## 2021-08-24 DIAGNOSIS — H903 Sensorineural hearing loss, bilateral: Secondary | ICD-10-CM | POA: Diagnosis not present

## 2021-09-08 DIAGNOSIS — M65312 Trigger thumb, left thumb: Secondary | ICD-10-CM | POA: Diagnosis not present

## 2021-10-05 DIAGNOSIS — Z20822 Contact with and (suspected) exposure to covid-19: Secondary | ICD-10-CM | POA: Diagnosis not present

## 2021-10-06 DIAGNOSIS — U071 COVID-19: Secondary | ICD-10-CM | POA: Diagnosis not present

## 2021-10-27 DIAGNOSIS — H401212 Low-tension glaucoma, right eye, moderate stage: Secondary | ICD-10-CM | POA: Diagnosis not present

## 2021-11-01 DIAGNOSIS — H903 Sensorineural hearing loss, bilateral: Secondary | ICD-10-CM | POA: Diagnosis not present

## 2021-11-02 DIAGNOSIS — Z20822 Contact with and (suspected) exposure to covid-19: Secondary | ICD-10-CM | POA: Diagnosis not present

## 2021-12-06 DIAGNOSIS — M65312 Trigger thumb, left thumb: Secondary | ICD-10-CM | POA: Diagnosis not present

## 2021-12-08 DIAGNOSIS — H401132 Primary open-angle glaucoma, bilateral, moderate stage: Secondary | ICD-10-CM | POA: Diagnosis not present

## 2022-01-03 DIAGNOSIS — M65312 Trigger thumb, left thumb: Secondary | ICD-10-CM | POA: Diagnosis not present

## 2022-01-05 DIAGNOSIS — Z0000 Encounter for general adult medical examination without abnormal findings: Secondary | ICD-10-CM | POA: Diagnosis not present

## 2022-01-05 DIAGNOSIS — Z1389 Encounter for screening for other disorder: Secondary | ICD-10-CM | POA: Diagnosis not present

## 2022-01-05 DIAGNOSIS — Z1211 Encounter for screening for malignant neoplasm of colon: Secondary | ICD-10-CM | POA: Diagnosis not present

## 2022-01-06 ENCOUNTER — Other Ambulatory Visit: Payer: Self-pay | Admitting: Family Medicine

## 2022-01-06 DIAGNOSIS — M8588 Other specified disorders of bone density and structure, other site: Secondary | ICD-10-CM

## 2022-01-10 DIAGNOSIS — E7800 Pure hypercholesterolemia, unspecified: Secondary | ICD-10-CM | POA: Diagnosis not present

## 2022-01-10 DIAGNOSIS — R002 Palpitations: Secondary | ICD-10-CM | POA: Diagnosis not present

## 2022-01-10 DIAGNOSIS — Z13220 Encounter for screening for lipoid disorders: Secondary | ICD-10-CM | POA: Diagnosis not present

## 2022-01-10 DIAGNOSIS — E871 Hypo-osmolality and hyponatremia: Secondary | ICD-10-CM | POA: Diagnosis not present

## 2022-01-10 DIAGNOSIS — G43909 Migraine, unspecified, not intractable, without status migrainosus: Secondary | ICD-10-CM | POA: Diagnosis not present

## 2022-01-10 DIAGNOSIS — Z1211 Encounter for screening for malignant neoplasm of colon: Secondary | ICD-10-CM | POA: Diagnosis not present

## 2022-01-10 DIAGNOSIS — M8588 Other specified disorders of bone density and structure, other site: Secondary | ICD-10-CM | POA: Diagnosis not present

## 2022-01-10 DIAGNOSIS — Z131 Encounter for screening for diabetes mellitus: Secondary | ICD-10-CM | POA: Diagnosis not present

## 2022-01-26 ENCOUNTER — Other Ambulatory Visit: Payer: Self-pay | Admitting: Family Medicine

## 2022-01-26 DIAGNOSIS — Z1231 Encounter for screening mammogram for malignant neoplasm of breast: Secondary | ICD-10-CM

## 2022-02-28 DIAGNOSIS — M65312 Trigger thumb, left thumb: Secondary | ICD-10-CM | POA: Diagnosis not present

## 2022-04-13 DIAGNOSIS — E559 Vitamin D deficiency, unspecified: Secondary | ICD-10-CM | POA: Diagnosis not present

## 2022-04-13 DIAGNOSIS — E7800 Pure hypercholesterolemia, unspecified: Secondary | ICD-10-CM | POA: Diagnosis not present

## 2022-04-25 DIAGNOSIS — H401132 Primary open-angle glaucoma, bilateral, moderate stage: Secondary | ICD-10-CM | POA: Diagnosis not present

## 2022-04-28 DIAGNOSIS — L821 Other seborrheic keratosis: Secondary | ICD-10-CM | POA: Diagnosis not present

## 2022-04-28 DIAGNOSIS — D2262 Melanocytic nevi of left upper limb, including shoulder: Secondary | ICD-10-CM | POA: Diagnosis not present

## 2022-04-28 DIAGNOSIS — L814 Other melanin hyperpigmentation: Secondary | ICD-10-CM | POA: Diagnosis not present

## 2022-04-28 DIAGNOSIS — D225 Melanocytic nevi of trunk: Secondary | ICD-10-CM | POA: Diagnosis not present

## 2022-04-28 DIAGNOSIS — L578 Other skin changes due to chronic exposure to nonionizing radiation: Secondary | ICD-10-CM | POA: Diagnosis not present

## 2022-05-02 DIAGNOSIS — M65312 Trigger thumb, left thumb: Secondary | ICD-10-CM | POA: Diagnosis not present

## 2022-05-10 DIAGNOSIS — Z23 Encounter for immunization: Secondary | ICD-10-CM | POA: Diagnosis not present

## 2022-05-22 DIAGNOSIS — D171 Benign lipomatous neoplasm of skin and subcutaneous tissue of trunk: Secondary | ICD-10-CM | POA: Diagnosis not present

## 2022-05-22 DIAGNOSIS — R42 Dizziness and giddiness: Secondary | ICD-10-CM | POA: Diagnosis not present

## 2022-05-22 DIAGNOSIS — T753XXD Motion sickness, subsequent encounter: Secondary | ICD-10-CM | POA: Diagnosis not present

## 2022-05-22 DIAGNOSIS — Z6824 Body mass index (BMI) 24.0-24.9, adult: Secondary | ICD-10-CM | POA: Diagnosis not present

## 2022-06-01 DIAGNOSIS — M65312 Trigger thumb, left thumb: Secondary | ICD-10-CM | POA: Diagnosis not present

## 2022-06-12 DIAGNOSIS — Z23 Encounter for immunization: Secondary | ICD-10-CM | POA: Diagnosis not present

## 2022-07-12 ENCOUNTER — Ambulatory Visit
Admission: RE | Admit: 2022-07-12 | Discharge: 2022-07-12 | Disposition: A | Discharge status: Home / Self Care | Payer: Medicare Other | Source: Ambulatory Visit | Attending: Family Medicine | Admitting: Family Medicine

## 2022-07-12 DIAGNOSIS — M85852 Other specified disorders of bone density and structure, left thigh: Secondary | ICD-10-CM | POA: Diagnosis not present

## 2022-07-12 DIAGNOSIS — Z1231 Encounter for screening mammogram for malignant neoplasm of breast: Secondary | ICD-10-CM

## 2022-07-12 DIAGNOSIS — M8588 Other specified disorders of bone density and structure, other site: Secondary | ICD-10-CM

## 2022-07-12 DIAGNOSIS — Z780 Asymptomatic menopausal state: Secondary | ICD-10-CM | POA: Diagnosis not present

## 2022-09-19 DIAGNOSIS — H401132 Primary open-angle glaucoma, bilateral, moderate stage: Secondary | ICD-10-CM | POA: Diagnosis not present

## 2022-10-19 DIAGNOSIS — H401132 Primary open-angle glaucoma, bilateral, moderate stage: Secondary | ICD-10-CM | POA: Diagnosis not present

## 2023-01-22 DIAGNOSIS — H9190 Unspecified hearing loss, unspecified ear: Secondary | ICD-10-CM | POA: Diagnosis not present

## 2023-01-22 DIAGNOSIS — Z6824 Body mass index (BMI) 24.0-24.9, adult: Secondary | ICD-10-CM | POA: Diagnosis not present

## 2023-01-22 DIAGNOSIS — G43909 Migraine, unspecified, not intractable, without status migrainosus: Secondary | ICD-10-CM | POA: Diagnosis not present

## 2023-01-22 DIAGNOSIS — E871 Hypo-osmolality and hyponatremia: Secondary | ICD-10-CM | POA: Diagnosis not present

## 2023-01-22 DIAGNOSIS — Z131 Encounter for screening for diabetes mellitus: Secondary | ICD-10-CM | POA: Diagnosis not present

## 2023-01-22 DIAGNOSIS — E559 Vitamin D deficiency, unspecified: Secondary | ICD-10-CM | POA: Diagnosis not present

## 2023-01-22 DIAGNOSIS — E7800 Pure hypercholesterolemia, unspecified: Secondary | ICD-10-CM | POA: Diagnosis not present

## 2023-01-22 DIAGNOSIS — Z23 Encounter for immunization: Secondary | ICD-10-CM | POA: Diagnosis not present

## 2023-01-22 DIAGNOSIS — Z0000 Encounter for general adult medical examination without abnormal findings: Secondary | ICD-10-CM | POA: Diagnosis not present

## 2023-01-22 DIAGNOSIS — H6123 Impacted cerumen, bilateral: Secondary | ICD-10-CM | POA: Diagnosis not present

## 2023-01-22 DIAGNOSIS — M8588 Other specified disorders of bone density and structure, other site: Secondary | ICD-10-CM | POA: Diagnosis not present

## 2023-01-23 DIAGNOSIS — H401132 Primary open-angle glaucoma, bilateral, moderate stage: Secondary | ICD-10-CM | POA: Diagnosis not present

## 2023-04-16 DIAGNOSIS — M8588 Other specified disorders of bone density and structure, other site: Secondary | ICD-10-CM | POA: Diagnosis not present

## 2023-04-16 DIAGNOSIS — E559 Vitamin D deficiency, unspecified: Secondary | ICD-10-CM | POA: Diagnosis not present

## 2023-04-16 DIAGNOSIS — E7800 Pure hypercholesterolemia, unspecified: Secondary | ICD-10-CM | POA: Diagnosis not present

## 2023-04-16 DIAGNOSIS — E871 Hypo-osmolality and hyponatremia: Secondary | ICD-10-CM | POA: Diagnosis not present

## 2023-05-01 DIAGNOSIS — Z6824 Body mass index (BMI) 24.0-24.9, adult: Secondary | ICD-10-CM | POA: Diagnosis not present

## 2023-05-01 DIAGNOSIS — M79673 Pain in unspecified foot: Secondary | ICD-10-CM | POA: Diagnosis not present

## 2023-05-15 DIAGNOSIS — M722 Plantar fascial fibromatosis: Secondary | ICD-10-CM | POA: Diagnosis not present

## 2023-06-26 DIAGNOSIS — Z23 Encounter for immunization: Secondary | ICD-10-CM | POA: Diagnosis not present

## 2023-06-27 DIAGNOSIS — H903 Sensorineural hearing loss, bilateral: Secondary | ICD-10-CM | POA: Diagnosis not present

## 2023-06-29 DIAGNOSIS — L814 Other melanin hyperpigmentation: Secondary | ICD-10-CM | POA: Diagnosis not present

## 2023-06-29 DIAGNOSIS — D225 Melanocytic nevi of trunk: Secondary | ICD-10-CM | POA: Diagnosis not present

## 2023-06-29 DIAGNOSIS — L578 Other skin changes due to chronic exposure to nonionizing radiation: Secondary | ICD-10-CM | POA: Diagnosis not present

## 2023-06-29 DIAGNOSIS — D485 Neoplasm of uncertain behavior of skin: Secondary | ICD-10-CM | POA: Diagnosis not present

## 2023-06-29 DIAGNOSIS — L821 Other seborrheic keratosis: Secondary | ICD-10-CM | POA: Diagnosis not present

## 2023-06-29 DIAGNOSIS — D2271 Melanocytic nevi of right lower limb, including hip: Secondary | ICD-10-CM | POA: Diagnosis not present

## 2023-06-29 DIAGNOSIS — D2262 Melanocytic nevi of left upper limb, including shoulder: Secondary | ICD-10-CM | POA: Diagnosis not present

## 2023-07-02 DIAGNOSIS — M722 Plantar fascial fibromatosis: Secondary | ICD-10-CM | POA: Diagnosis not present

## 2023-07-24 IMAGING — MG MM DIGITAL SCREENING BILAT W/ TOMO AND CAD
8 series · 8 of 24 positions shown · non-contrast
Comparison: Previous exam(s).

CLINICAL DATA: Screening.

EXAM:
DIGITAL SCREENING BILATERAL MAMMOGRAM WITH TOMOSYNTHESIS AND CAD
TECHNIQUE: Bilateral screening digital craniocaudal and mediolateral oblique
mammograms were obtained. Bilateral screening digital breast
tomosynthesis was performed. The images were evaluated with
computer-aided detection.

[R MLO synth-2D]
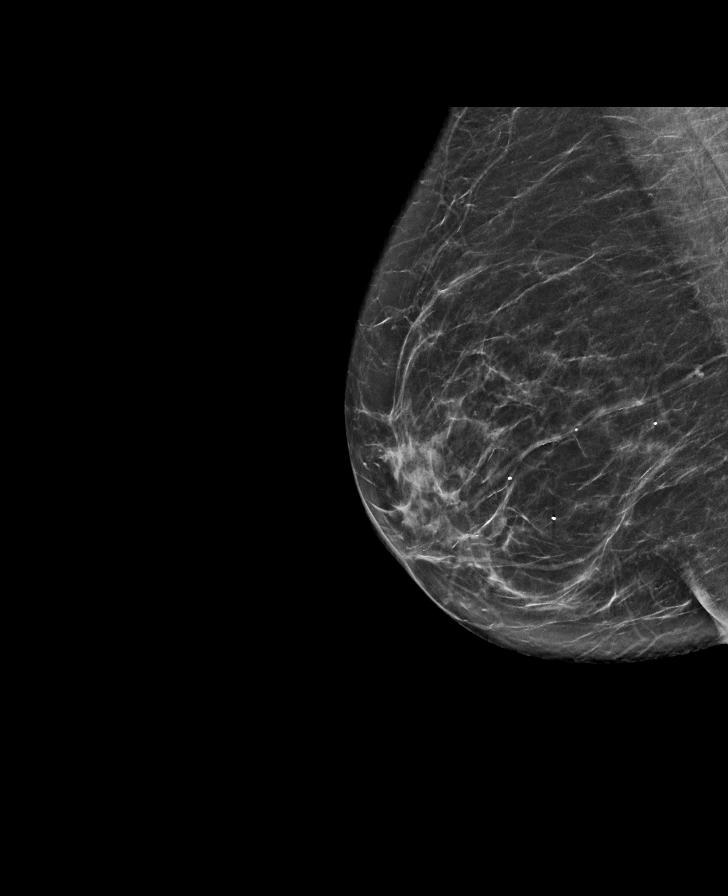

[R CC synth-2D]
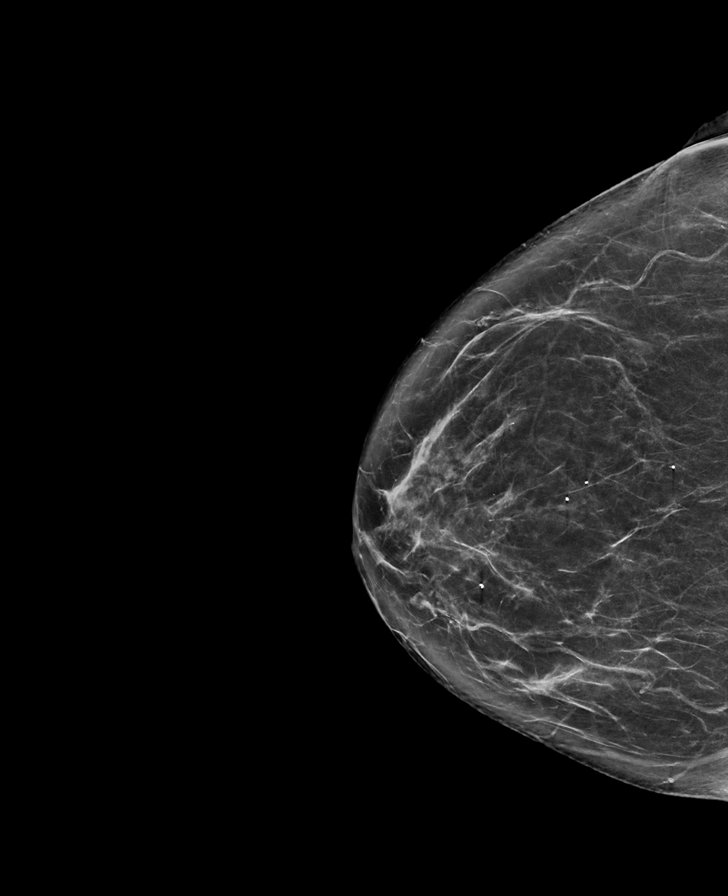

[L MLO synth-2D]
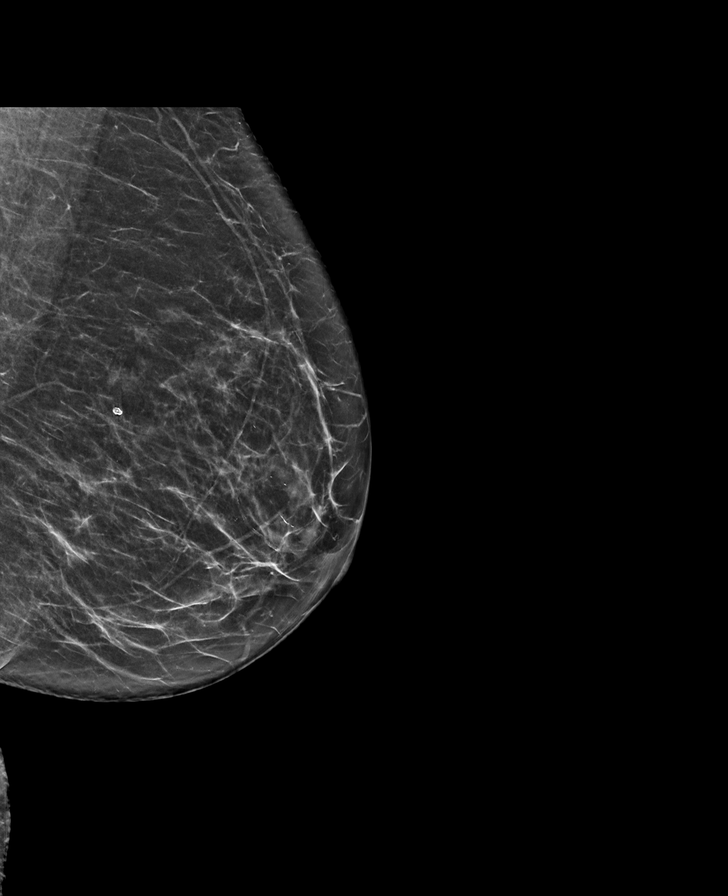

[L CC synth-2D]
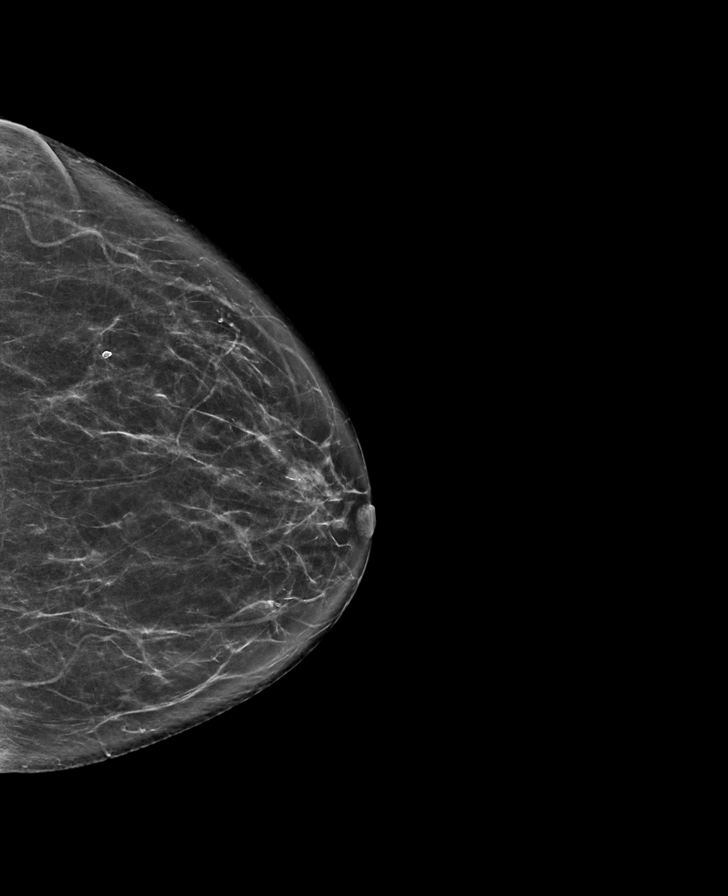

[R MLO tomo · tomo slice 37/74.0]
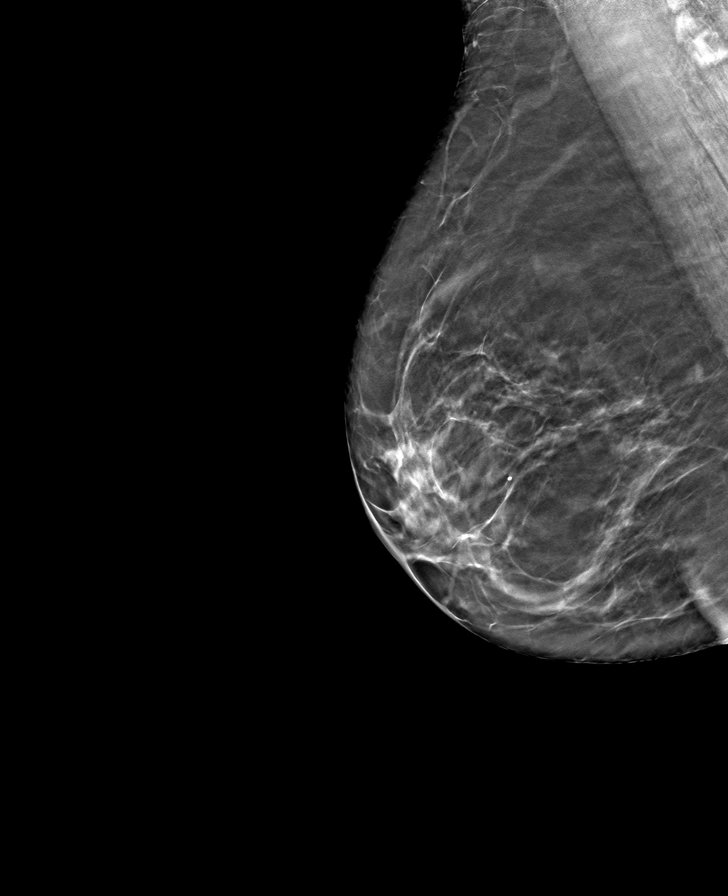

[L CC tomo · tomo slice 36/71.0]
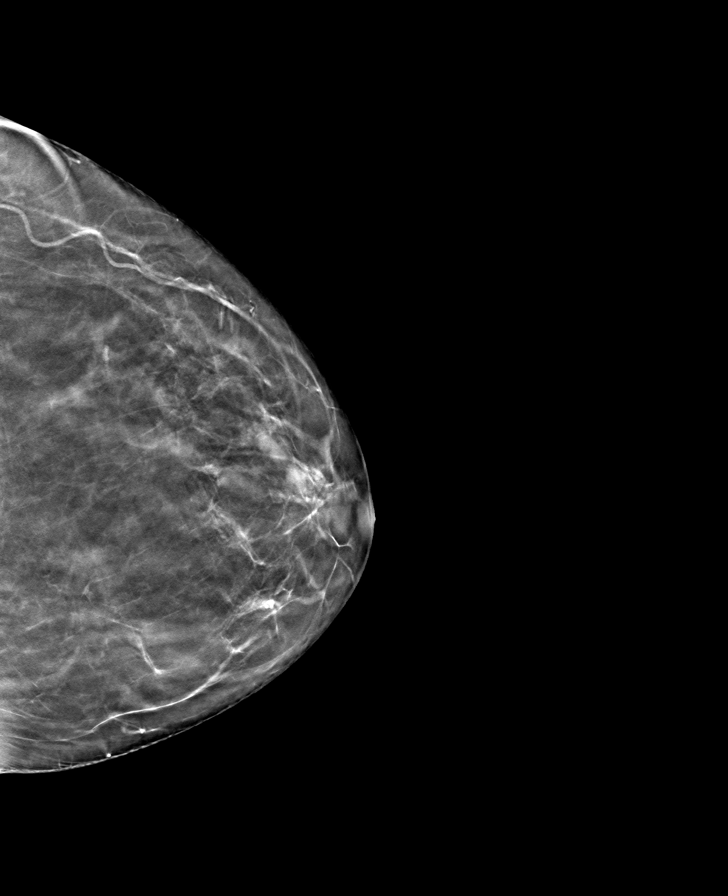

[R CC tomo · tomo slice 38/75.0]
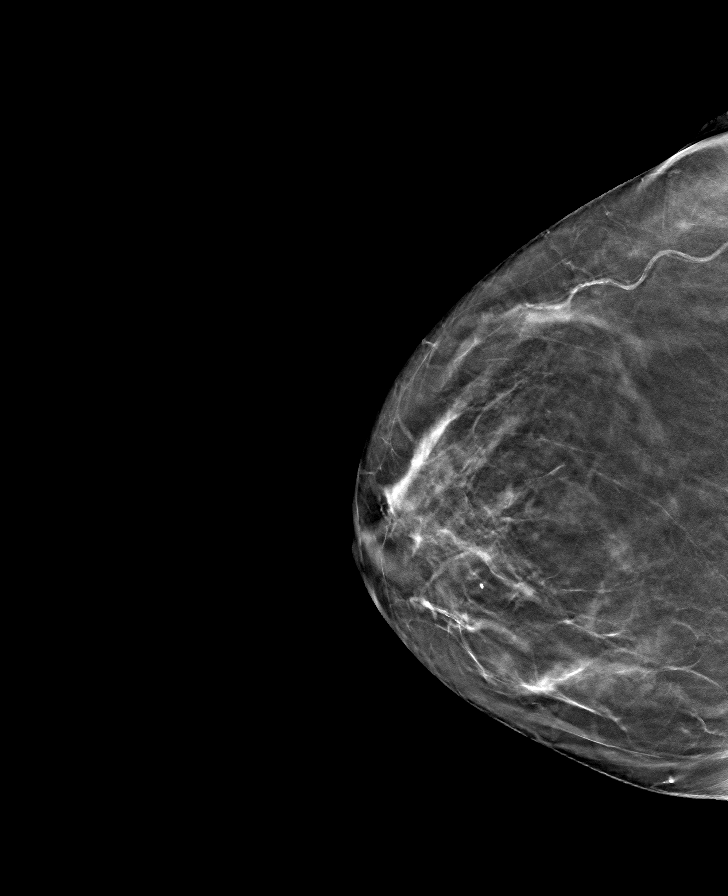

[L MLO tomo · tomo slice 35/70.0]
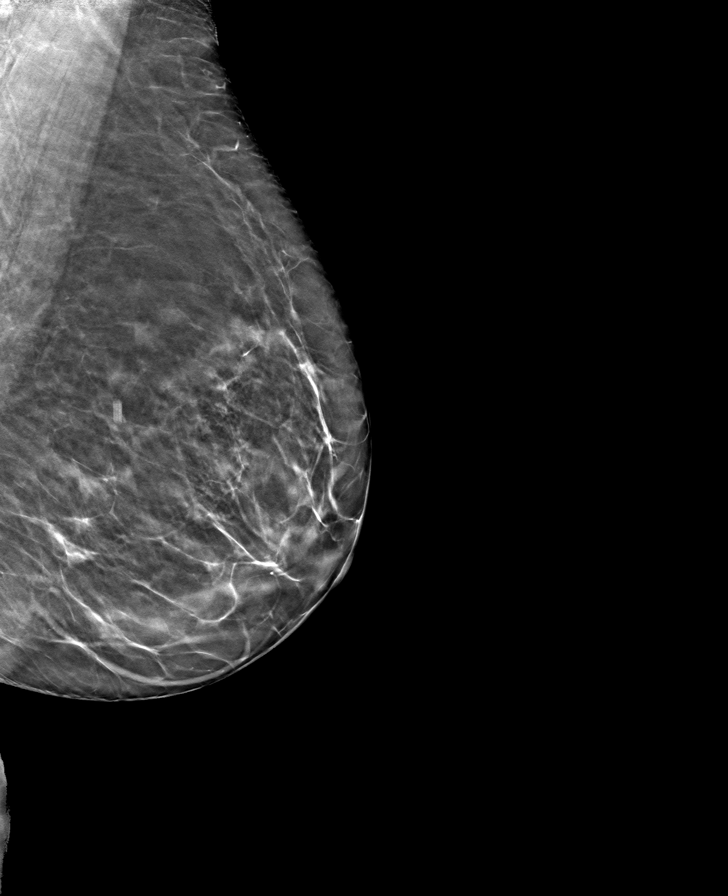

[8 of 24 positions shown; findings below may reference images not displayed]

ACR Breast Density Category b: There are scattered areas of
fibroglandular density.
FINDINGS: There are no findings suspicious for malignancy.
IMPRESSION: No mammographic evidence of malignancy. A result letter of this
screening mammogram will be mailed directly to the patient.

RECOMMENDATION:
Screening mammogram in one year. (Code:51-O-LD2)

BI-RADS CATEGORY  1: Negative.

## 2023-07-26 DIAGNOSIS — H401212 Low-tension glaucoma, right eye, moderate stage: Secondary | ICD-10-CM | POA: Diagnosis not present

## 2023-08-14 ENCOUNTER — Other Ambulatory Visit: Payer: Self-pay | Admitting: Family Medicine

## 2023-08-14 DIAGNOSIS — Z1231 Encounter for screening mammogram for malignant neoplasm of breast: Secondary | ICD-10-CM

## 2023-09-13 ENCOUNTER — Ambulatory Visit
Admission: RE | Admit: 2023-09-13 | Discharge: 2023-09-13 | Disposition: A | Discharge status: Home / Self Care | Payer: Medicare Other | Source: Ambulatory Visit

## 2023-09-13 DIAGNOSIS — Z1231 Encounter for screening mammogram for malignant neoplasm of breast: Secondary | ICD-10-CM

## 2023-12-19 DIAGNOSIS — Z23 Encounter for immunization: Secondary | ICD-10-CM | POA: Diagnosis not present

## 2024-01-02 DIAGNOSIS — E7800 Pure hypercholesterolemia, unspecified: Secondary | ICD-10-CM | POA: Diagnosis not present

## 2024-01-03 DIAGNOSIS — H903 Sensorineural hearing loss, bilateral: Secondary | ICD-10-CM | POA: Diagnosis not present

## 2024-01-21 ENCOUNTER — Inpatient Hospital Stay (HOSPITAL_BASED_OUTPATIENT_CLINIC_OR_DEPARTMENT_OTHER)
Admission: EM | Admit: 2024-01-21 | Discharge: 2024-01-23 | DRG: 645 | Disposition: A | Discharge status: Home / Self Care | Attending: Internal Medicine | Admitting: Internal Medicine

## 2024-01-21 ENCOUNTER — Encounter (HOSPITAL_BASED_OUTPATIENT_CLINIC_OR_DEPARTMENT_OTHER): Payer: Self-pay | Admitting: Emergency Medicine

## 2024-01-21 ENCOUNTER — Other Ambulatory Visit: Payer: Self-pay

## 2024-01-21 DIAGNOSIS — Z79899 Other long term (current) drug therapy: Secondary | ICD-10-CM

## 2024-01-21 DIAGNOSIS — Z8249 Family history of ischemic heart disease and other diseases of the circulatory system: Secondary | ICD-10-CM

## 2024-01-21 DIAGNOSIS — M810 Age-related osteoporosis without current pathological fracture: Secondary | ICD-10-CM | POA: Diagnosis present

## 2024-01-21 DIAGNOSIS — R030 Elevated blood-pressure reading, without diagnosis of hypertension: Secondary | ICD-10-CM | POA: Diagnosis not present

## 2024-01-21 DIAGNOSIS — Z8262 Family history of osteoporosis: Secondary | ICD-10-CM | POA: Diagnosis not present

## 2024-01-21 DIAGNOSIS — E871 Hypo-osmolality and hyponatremia: Secondary | ICD-10-CM | POA: Diagnosis not present

## 2024-01-21 DIAGNOSIS — R410 Disorientation, unspecified: Secondary | ICD-10-CM | POA: Diagnosis not present

## 2024-01-21 DIAGNOSIS — I10 Essential (primary) hypertension: Secondary | ICD-10-CM | POA: Diagnosis not present

## 2024-01-21 DIAGNOSIS — Z7983 Long term (current) use of bisphosphonates: Secondary | ICD-10-CM | POA: Diagnosis not present

## 2024-01-21 DIAGNOSIS — I700 Atherosclerosis of aorta: Secondary | ICD-10-CM | POA: Diagnosis not present

## 2024-01-21 DIAGNOSIS — E222 Syndrome of inappropriate secretion of antidiuretic hormone: Secondary | ICD-10-CM | POA: Diagnosis not present

## 2024-01-21 DIAGNOSIS — Z87891 Personal history of nicotine dependence: Secondary | ICD-10-CM

## 2024-01-21 DIAGNOSIS — Z888 Allergy status to other drugs, medicaments and biological substances status: Secondary | ICD-10-CM | POA: Diagnosis not present

## 2024-01-21 DIAGNOSIS — Z743 Need for continuous supervision: Secondary | ICD-10-CM | POA: Diagnosis not present

## 2024-01-21 DIAGNOSIS — R0789 Other chest pain: Secondary | ICD-10-CM | POA: Diagnosis not present

## 2024-01-21 DIAGNOSIS — R631 Polydipsia: Secondary | ICD-10-CM | POA: Diagnosis not present

## 2024-01-21 DIAGNOSIS — K5900 Constipation, unspecified: Secondary | ICD-10-CM | POA: Diagnosis not present

## 2024-01-21 LAB — BASIC METABOLIC PANEL WITH GFR
Anion gap: 13 (ref 5–15)
BUN: 8 mg/dL (ref 8–23)
CO2: 21 mmol/L — ABNORMAL LOW (ref 22–32)
Calcium: 9.3 mg/dL (ref 8.9–10.3)
Chloride: 87 mmol/L — ABNORMAL LOW (ref 98–111)
Creatinine, Ser: 0.51 mg/dL (ref 0.44–1.00)
GFR, Estimated: >60 mL/min (ref >60)
Glucose, Bld: 109 mg/dL — ABNORMAL HIGH (ref 70–99)
Potassium: 4.5 mmol/L (ref 3.5–5.1)
Sodium: 121 mmol/L — ABNORMAL LOW (ref 135–145)

## 2024-01-21 LAB — MAGNESIUM: Magnesium: 2 mg/dL (ref 1.7–2.4)

## 2024-01-21 LAB — OSMOLALITY, URINE: Osmolality, Ur: 323 mosm/kg (ref 300–900)

## 2024-01-21 LAB — OSMOLALITY: Osmolality: 251 mosm/kg — ABNORMAL LOW (ref 275–295)

## 2024-01-21 LAB — SODIUM, URINE, RANDOM: Sodium, Ur: 111 mmol/L

## 2024-01-21 LAB — CBC
HCT: 35 % — ABNORMAL LOW (ref 36.0–46.0)
Hemoglobin: 12.2 g/dL (ref 12.0–15.0)
MCH: 29.5 pg (ref 26.0–34.0)
MCHC: 34.9 g/dL (ref 30.0–36.0)
MCV: 84.7 fL (ref 80.0–100.0)
Platelets: 308 10*3/uL (ref 150–400)
RBC: 4.13 MIL/uL (ref 3.87–5.11)
RDW: 11.8 % (ref 11.5–15.5)
WBC: 6.6 10*3/uL (ref 4.0–10.5)
nRBC: 0 % (ref 0.0–0.2)

## 2024-01-21 NOTE — ED Provider Notes (Signed)
 [place] EMERGENCY DEPARTMENT AT [facility] Provider Note   CSN: [id number] Arrival date & time: 01/21/24  1824     History  Chief Complaint  Patient presents with   Hyponatremia    Tina Wagner is a 78 y.o. female.  Patient with no significant past medical history presents to the emergency department for low sodium diagnosed today at [facility] walk-in clinic.  Patient states that she has had slightly decreased exercise tolerance over the past few months.  She does exercise regularly.  Yesterday she had the opportunity to get her blood pressure checked and when she did it was elevated into the 160-170 systolic range.  She called her primary care doctor this morning for a follow-up appointment and was referred to [facility] walk-in clinic.  She was unable to get an appointment until May 21.  She went to the clinic today, had lab work drawn, and this demonstrated a sodium of 121.  She was told to come to the emergency department.  Patient endorses 1 episode today where her "mind blanked" but she has not been confused.  No generalized weakness or muscle cramping.  No recent nausea, vomiting, or diarrhea.  As far as her blood pressure, no previous history.  She is not on any medications for this and denies diuretics for other reasons.  She does report drinking a lot of water, filling up her thermos bottle 3-4 times a day.  She does report some constipation recently, but this is not unusual.  She took Metamucil for this today.  No edema.  No history of ascites or abdominal swelling.  She has not had labs since July of last year.  She does report having slightly low sodium in the past, but just a couple of points low, not lower than 130.       Home Medications Prior to Admission medications   Medication Sig Start Date End Date Taking? Authorizing Provider  Calcium Carb-Cholecalciferol 600-200 MG-UNIT TABS 2 tablet with food    [provider]  dorzolamide-timolol (COSOPT) 22.3-6.8  MG/ML ophthalmic solution Place 1 drop into both eyes 2 (two) times daily. 10/17/15   [provider]  meclizine (ANTIVERT) 12.5 MG tablet 1 tablet    [provider]  Multiple Vitamins-Minerals (MULTIVITAMIN PO) Take 1 tablet by mouth daily.    [provider]  TRAVATAN Z 0.004 % SOLN ophthalmic solution Place 1 drop into both eyes at bedtime. 10/03/15   [provider]      Allergies    Celexa [citalopram]    Review of Systems   Review of Systems  Physical Exam Updated Vital Signs BP (!) 192/89 (BP Location: Right Arm)   Pulse 73   Temp 97.9 F (36.6 C)   Resp 16   SpO2 99%   Physical Exam Vitals and nursing note reviewed.  Constitutional:      Appearance: She is well-developed. She is not diaphoretic.  HENT:     Head: Normocephalic and atraumatic.     Mouth/Throat:     Mouth: Mucous membranes are not dry.  Eyes:     Conjunctiva/sclera: Conjunctivae normal.  Neck:     Vascular: Normal carotid pulses. No JVD.     Trachea: Trachea normal. No tracheal deviation.  Cardiovascular:     Rate and Rhythm: Normal rate and regular rhythm.     Pulses: No decreased pulses.          Radial pulses are 2+ on the right side and 2+ on  the left side.     Heart sounds: Normal heart sounds, S1 normal and S2 normal. No murmur heard. Pulmonary:     Effort: Pulmonary effort is normal. No respiratory distress.     Breath sounds: No wheezing.  Chest:     Chest wall: No tenderness.  Abdominal:     General: Bowel sounds are normal.     Palpations: Abdomen is soft.     Tenderness: There is no abdominal tenderness. There is no guarding or rebound.  Musculoskeletal:        General: Normal range of motion.     Cervical back: Normal range of motion and neck supple. No muscular tenderness.     Right lower leg: No edema.     Left lower leg: No edema.  Skin:    General: Skin is warm and dry.     Coloration: Skin is not pale.  Neurological:     Mental Status:  She is alert.     ED Results / Procedures / Treatments   Labs (all labs ordered are listed, but only abnormal results are displayed) Labs Reviewed  CBC - Abnormal; Notable for the following components:      Result Value   HCT 35.0 (*)    All other components within normal limits  BASIC METABOLIC PANEL WITH GFR - Abnormal; Notable for the following components:   Sodium 121 (*)    Chloride 87 (*)    CO2 21 (*)    Glucose, Bld 109 (*)    All other components within normal limits  MAGNESIUM  OSMOLALITY  OSMOLALITY, URINE  SODIUM, URINE, RANDOM    ED ECG REPORT   Date: 01/21/2024  Rate: 68  Rhythm: normal sinus rhythm  QRS Axis: normal  Intervals: normal  ST/T Wave abnormalities: normal  Conduction Disutrbances:none  Narrative Interpretation:   Old EKG Reviewed: unchanged  I have personally reviewed the EKG tracing and agree with the computerized printout as noted.   Radiology No results found.  Procedures Procedures    Medications Ordered in ED Medications - No data to display  ED Course/ Medical Decision Making/ A&P    Patient seen and examined. History obtained directly from patient.  Daughter also at bedside.  Labs/EKG: Ordered CBC, BMP, magnesium.  EKG personally reviewed and interpreted as above.  Imaging: None ordered  Medications/Fluids: None ordered  Initial impression: Hyponatremia, will confirm.  Unclear if this is acute or chronic, but patient is minimally symptomatic.  Discussed with Dr. Adrain Alar.   8:26 PM Reassessment performed. Patient appears stable.  Labs personally reviewed and interpreted including: CBC unremarkable; BMP with sodium 121, chloride 87, normal kidney function, glucose 109; magnesium 2.0 normal.  I have added serum osmolality, urine osmolality, urine sodium.  Reviewed pertinent lab work and imaging with patient at bedside. Questions answered.   Most current vital signs reviewed and are as follows: BP (!) 181/89   Pulse  62   Temp 97.9 F (36.6 C)   Resp 16   SpO2 98%   Plan: Patient looks very well but given degree of hyponatremia, unclear if this is acute or chronic, recommend admission to the hospital.  Patient in agreement.  8:58 PM I discussed case with Dr. Brock Canner, [facility] hospitalist, who accepts for admission.                                 Medical Decision Making Amount and/or Complexity of  Data Reviewed Labs: ordered.  Risk Decision regarding hospitalization.   Patient with hyponatremia, unclear chronicity and unclear underlying cause, other than.  No severe symptoms at this time.  Currently restricting free water.  Will admit for further evaluation and treatment due to severity of hyponatremia.        Final Clinical Impression(s) / ED Diagnoses Final diagnoses:  Hyponatremia  Hypertension, unspecified type    Rx / DC Orders ED Discharge Orders     None         Lyna Sandhoff, Kirby Peoples 01/21/24 2100    Rosealee Concha, MD 01/21/24 2118

## 2024-01-21 NOTE — Progress Notes (Signed)
 Hospitalist Transfer Note:    Nursing staff, Please call [facility] Admits & Consults System-Wide number on Amion [phone]) as soon as patient's arrival, so appropriate admitting provider can evaluate the pt.   Transferring facility: [facility]  Requesting provider: Lyna Sandhoff, PA (EDP at [facility]) Reason for transfer: admission for further evaluation and management of hyponatremia.     78 y.o. female, with no reported significant past medical history, who presented to [facility] ED for evaluation of hyponatremia identified on outpatient labs.   The patient, he is reported to be without any prior medical diagnoses, including no known history of hypertension, had presented to walk-in clinic over the last day for evaluation of incidentally noted elevated systolic blood pressures in the 160s to 170s mmHg. at walk-in clinic, labs were obtained, and were reported to be notable for serum sodium of 121, without any reported recent prior serum sodium data points for point comparison to establish degree of chronicity.  As a result of this low sodium level, the patient was instructed to present to the emergency department for further evaluation/ management of this finding of hyponatremia.  The patient reports that she has noted some diminished exercise tolerance over the last few months.  Otherwise, she denies any acute or subacute symptoms.  Denies any recent shortness of breath, new abdominal discomfort or distention, or new lower extremity edema.  She also denies any recent nausea, vomiting, diarrhea, and is not on any diuretic medications at home.  No recent seizures or acute focal neurologic deficits reported.  No report of any recent trauma.  She reports that she is active at baseline, including frequent exercising, and does consume a large volume of water on daily basis as a result of this.  Labs were notable for BMP, which re-demonstrated serum sodium level of 121.  Serum osmolality as well as urine studies are  currently pending.  Subsequently, I accepted this patient for transfer for observation to a med-tele bed at [facility] or [facility] (first available) for further work-up and management of the above.      Camelia Cavalier, DO Hospitalist

## 2024-01-21 NOTE — ED Notes (Signed)
 [facility] at bedside to transport pt to [facility]

## 2024-01-21 NOTE — ED Triage Notes (Signed)
 Seen at walk in clinic for ongoing hypertension- 180's sys bp. EKG and labs drawn- called for Na+ 121. Endorses a few moments of confusion.

## 2024-01-21 NOTE — ED Notes (Addendum)
 Tina Wagner

## 2024-01-22 ENCOUNTER — Encounter (HOSPITAL_COMMUNITY): Payer: Self-pay | Admitting: Family Medicine

## 2024-01-22 ENCOUNTER — Inpatient Hospital Stay (HOSPITAL_COMMUNITY)

## 2024-01-22 DIAGNOSIS — Z8262 Family history of osteoporosis: Secondary | ICD-10-CM

## 2024-01-22 DIAGNOSIS — K5900 Constipation, unspecified: Secondary | ICD-10-CM | POA: Diagnosis present

## 2024-01-22 DIAGNOSIS — R030 Elevated blood-pressure reading, without diagnosis of hypertension: Secondary | ICD-10-CM | POA: Diagnosis present

## 2024-01-22 DIAGNOSIS — M810 Age-related osteoporosis without current pathological fracture: Secondary | ICD-10-CM | POA: Diagnosis present

## 2024-01-22 DIAGNOSIS — Z8249 Family history of ischemic heart disease and other diseases of the circulatory system: Secondary | ICD-10-CM | POA: Diagnosis not present

## 2024-01-22 DIAGNOSIS — Z888 Allergy status to other drugs, medicaments and biological substances status: Secondary | ICD-10-CM

## 2024-01-22 DIAGNOSIS — E222 Syndrome of inappropriate secretion of antidiuretic hormone: Principal | ICD-10-CM | POA: Diagnosis present

## 2024-01-22 DIAGNOSIS — I10 Essential (primary) hypertension: Secondary | ICD-10-CM | POA: Diagnosis present

## 2024-01-22 DIAGNOSIS — Z7983 Long term (current) use of bisphosphonates: Secondary | ICD-10-CM

## 2024-01-22 DIAGNOSIS — E871 Hypo-osmolality and hyponatremia: Secondary | ICD-10-CM | POA: Diagnosis present

## 2024-01-22 DIAGNOSIS — Z87891 Personal history of nicotine dependence: Secondary | ICD-10-CM

## 2024-01-22 DIAGNOSIS — Z79899 Other long term (current) drug therapy: Secondary | ICD-10-CM | POA: Diagnosis not present

## 2024-01-22 LAB — BASIC METABOLIC PANEL WITH GFR
Anion gap: 11 (ref 5–15)
Anion gap: 13 (ref 5–15)
BUN: 8 mg/dL (ref 8–23)
BUN: 12 mg/dL (ref 8–23)
CO2: 21 mmol/L — ABNORMAL LOW (ref 22–32)
CO2: 20 mmol/L — ABNORMAL LOW (ref 22–32)
Calcium: 9.2 mg/dL (ref 8.9–10.3)
Calcium: 8.9 mg/dL (ref 8.9–10.3)
Chloride: 93 mmol/L — ABNORMAL LOW (ref 98–111)
Chloride: 95 mmol/L — ABNORMAL LOW (ref 98–111)
Creatinine, Ser: 0.55 mg/dL (ref 0.44–1.00)
Creatinine, Ser: 0.63 mg/dL (ref 0.44–1.00)
GFR, Estimated: >60 mL/min (ref >60)
GFR, Estimated: >60 mL/min (ref >60)
Glucose, Bld: 105 mg/dL — ABNORMAL HIGH (ref 70–99)
Glucose, Bld: 121 mg/dL — ABNORMAL HIGH (ref 70–99)
Potassium: 3.8 mmol/L (ref 3.5–5.1)
Potassium: 4.4 mmol/L (ref 3.5–5.1)
Sodium: 125 mmol/L — ABNORMAL LOW (ref 135–145)
Sodium: 128 mmol/L — ABNORMAL LOW (ref 135–145)

## 2024-01-22 LAB — ACTH STIMULATION, 3 TIME POINTS
Cortisol, 30 Min: 21.9 ug/dL
Cortisol, 60 Min: 29.7 ug/dL
Cortisol, Base: 8.4 ug/dL

## 2024-01-22 LAB — CBC
HCT: 38.2 % (ref 36.0–46.0)
Hemoglobin: 12.9 g/dL (ref 12.0–15.0)
MCH: 29.6 pg (ref 26.0–34.0)
MCHC: 33.8 g/dL (ref 30.0–36.0)
MCV: 87.6 fL (ref 80.0–100.0)
Platelets: 318 10*3/uL (ref 150–400)
RBC: 4.36 MIL/uL (ref 3.87–5.11)
RDW: 11.7 % (ref 11.5–15.5)
WBC: 4.4 10*3/uL (ref 4.0–10.5)
nRBC: 0 % (ref 0.0–0.2)

## 2024-01-22 LAB — TSH: TSH: 2.625 u[IU]/mL (ref 0.350–4.500)

## 2024-01-22 MED ORDER — SODIUM CHLORIDE 0.9% FLUSH
3.0000 mL | Freq: Two times a day (BID) | INTRAVENOUS | Status: DC
  Administered 2024-01-22 – 2024-01-23 (×4): 3 mL via INTRAVENOUS

## 2024-01-22 MED ORDER — ACETAMINOPHEN 650 MG RE SUPP: 650.0000 mg | Freq: Four times a day (QID) | RECTAL | Status: DC | PRN

## 2024-01-22 MED ORDER — COSYNTROPIN 0.25 MG IJ SOLR
0.2500 mg | Freq: Once | INTRAMUSCULAR | Status: AC
  Administered 2024-01-22: 0.25 mg via INTRAVENOUS
  Filled 2024-01-22 (×2): qty 0.25

## 2024-01-22 MED ORDER — SENNOSIDES-DOCUSATE SODIUM 8.6-50 MG PO TABS: 1.0000 | ORAL_TABLET | Freq: Every evening | ORAL | Status: DC | PRN

## 2024-01-22 MED ORDER — ACETAMINOPHEN 325 MG PO TABS
650.0000 mg | ORAL_TABLET | Freq: Four times a day (QID) | ORAL | Status: DC | PRN
  Administered 2024-01-22 – 2024-01-23 (×3): 650 mg via ORAL
  Filled 2024-01-22 (×3): qty 2

## 2024-01-22 MED ORDER — ONDANSETRON HCL 4 MG/2ML IJ SOLN: 4.0000 mg | Freq: Four times a day (QID) | INTRAMUSCULAR | Status: DC | PRN

## 2024-01-22 MED ORDER — ONDANSETRON HCL 4 MG PO TABS: 4.0000 mg | ORAL_TABLET | Freq: Four times a day (QID) | ORAL | Status: DC | PRN

## 2024-01-22 MED ORDER — HYDRALAZINE HCL 25 MG PO TABS: 25.0000 mg | ORAL_TABLET | Freq: Four times a day (QID) | ORAL | Status: DC | PRN

## 2024-01-22 MED ORDER — ENOXAPARIN SODIUM 40 MG/0.4ML IJ SOSY
40.0000 mg | PREFILLED_SYRINGE | INTRAMUSCULAR | Status: DC
  Administered 2024-01-22 – 2024-01-23 (×2): 40 mg via SUBCUTANEOUS
  Filled 2024-01-22 (×2): qty 0.4

## 2024-01-22 NOTE — Progress Notes (Signed)
   01/22/24 1145  TOC Brief Assessment  Insurance and Status Reviewed  Patient has primary care physician Yes  Home environment has been reviewed Single family home  Prior level of function: Independent  Prior/Current Home Services No current home services  Social Drivers of Health Review SDOH reviewed no interventions necessary  Readmission risk has been reviewed Yes (NA)  Transition of care needs transition of care needs identified, TOC will continue to follow   TOC screened pt and there are no needs at this time. TOC will continue to follow for any new needs or recommendations.

## 2024-01-22 NOTE — H&P (Signed)
 History and Physical    [name]:[id number] DOB: 16-Nov-1945 DOA: 01/21/2024  PCP: Darnelle Elders, PA-C   Patient coming from: Home   Chief Complaint: Elevated BP, low sodium   HPI: Tina Wagner is a 78 y.o. female with medical history significant for palpitations and atypical chest pain, now presenting with elevated blood pressure and low sodium.  Patient recently noted her systolic blood pressure to be elevated to the 160-170 range.  She was seen in a walk-in clinic for this today, had labs drawn, and was instructed to go to the ED for low sodium.  Patient denies headache, dizziness, nausea, vomiting, or diarrhea.  She has not increased her water intake recently and does not drink beer.  She does not take any prescription medications daily or adhere to any particular dietary restrictions.  She had not had any blood work performed recently until today.  [facility] ED Course: Upon arrival to the ED, patient is found to be afebrile and saturating well on room air with elevated blood pressure.  Labs are most notable for sodium 121, serum awesome 251, urine awesome 323, and urine sodium 111.  Patient was transferred to [facility] for admission.  Review of Systems:  All other systems reviewed and apart from HPI, are negative.  Past Medical History:  Diagnosis Date   Atypical chest pain 12/09/2015   Palpitations 12/09/2015    History reviewed. No pertinent surgical history.  Social History:   reports that she has quit smoking. She has never used smokeless tobacco. No history on file for alcohol use and drug use.  Allergies  Allergen Reactions   Celexa [Citalopram]     No emotion    Family History  Problem Relation Age of Onset   Osteoporosis Mother    COPD Mother    Heart disease Father    Lung disease Maternal Grandmother    Heart disease Maternal Grandfather      Prior to Admission medications   Medication Sig Start Date End Date Taking?  Authorizing Provider  Calcium Carb-Cholecalciferol 600-200 MG-UNIT TABS 2 tablet with food    [provider]  dorzolamide-timolol (COSOPT) 22.3-6.8 MG/ML ophthalmic solution Place 1 drop into both eyes 2 (two) times daily. 10/17/15   [provider]  meclizine (ANTIVERT) 12.5 MG tablet 1 tablet    [provider]  Multiple Vitamins-Minerals (MULTIVITAMIN PO) Take 1 tablet by mouth daily.    [provider]  TRAVATAN Z 0.004 % SOLN ophthalmic solution Place 1 drop into both eyes at bedtime. 10/03/15   [provider]    Physical Exam: Vitals:   01/21/24 1841 01/21/24 1930 01/21/24 2130 01/21/24 2238  BP: (!) 192/89 (!) 181/89 (!) 142/76 (!) 163/94  Pulse: 73 62 (!) 59 67  Resp: 16 16 12 14   Temp: 97.9 F (36.6 C)   97.8 F (36.6 C)  TempSrc:    Oral  SpO2: 99% 98% 97% 97%  Weight:    65 kg    Constitutional: NAD, calm  Eyes: PERTLA, lids and conjunctivae normal ENMT: Mucous membranes are moist. Posterior pharynx clear of any exudate or lesions.   Neck: supple, no masses  Respiratory: no wheezing, no crackles. No accessory muscle use.  Cardiovascular: S1 & S2 heard, regular rate and rhythm. No extremity edema.   Abdomen: No distension, no tenderness, soft. Bowel sounds active.  Musculoskeletal: no clubbing / cyanosis. No joint deformity upper and lower extremities.   Skin: no significant rashes, lesions,  ulcers. Warm, dry, well-perfused. Neurologic: CN 2-12 grossly intact. Moving all extremities. Alert and oriented.  Psychiatric: Pleasant. Cooperative.    Labs and Imaging on Admission: I have personally reviewed following labs and imaging studies  CBC: Recent Labs  Lab 01/21/24 1849  WBC 6.6  HGB 12.2  HCT 35.0*  MCV 84.7  PLT 308   Basic Metabolic Panel: Recent Labs  Lab 01/21/24 1849  NA 121*  K 4.5  CL 87*  CO2 21*  GLUCOSE 109*  BUN 8  CREATININE 0.51  CALCIUM 9.3  MG 2.0   GFR: CrCl cannot be calculated  (Unknown ideal weight.). Liver Function Tests: No results for input(s): "AST", "ALT", "ALKPHOS", "BILITOT", "PROT", "ALBUMIN" in the last 168 hours. No results for input(s): "LIPASE", "AMYLASE" in the last 168 hours. No results for input(s): "AMMONIA" in the last 168 hours. Coagulation Profile: No results for input(s): "INR", "PROTIME" in the last 168 hours. Cardiac Enzymes: No results for input(s): "CKTOTAL", "CKMB", "CKMBINDEX", "TROPONINI" in the last 168 hours. BNP (last 3 results) No results for input(s): "PROBNP" in the last 8760 hours. HbA1C: No results for input(s): "HGBA1C" in the last 72 hours. CBG: No results for input(s): "GLUCAP" in the last 168 hours. Lipid Profile: No results for input(s): "CHOL", "HDL", "LDLCALC", "TRIG", "CHOLHDL", "LDLDIRECT" in the last 72 hours. Thyroid  Function Tests: No results for input(s): "TSH", "T4TOTAL", "FREET4", "T3FREE", "THYROIDAB" in the last 72 hours. Anemia Panel: No results for input(s): "VITAMINB12", "FOLATE", "FERRITIN", "TIBC", "IRON", "RETICCTPCT" in the last 72 hours. Urine analysis: No results found for: "COLORURINE", "APPEARANCEUR", "LABSPEC", "PHURINE", "GLUCOSEU", "HGBUR", "BILIRUBINUR", "KETONESUR", "PROTEINUR", "UROBILINOGEN", "NITRITE", "LEUKOCYTESUR" Sepsis Labs: @LABRCNTIP (procalcitonin:4,lacticidven:4) )No results found for this or any previous visit (from the past 240 hours).   Radiological Exams on Admission: No results found.  EKG: Independently reviewed. Sinus rhythm.   Assessment/Plan   1. Hyponatremia  - Appears euvolemic and is asymptomatic  - With serum osm 251, urine osm 323, and urine sodium 111, Ddx includes glucocorticoid deficiency, severe hypothyroidism, or SIADH  - Check TSH and ACTH stim test, restrict free-water intake, and follow serial sodium levels   2. Elevated BP  - Treat as-needed for now, outpatient follow-up as planned    DVT prophylaxis: Lovenox  Code Status: Full  Level of Care:  Level of care: Telemetry Family Communication: Daughter at bedside   Disposition Plan:  Patient is from: home  Anticipated d/c is to: Home  Anticipated d/c date is: 01/23/24  Patient currently: Pending workup and treatment of hyponatremia  Consults called: None  Admission status: Observation     Walton Guppy, MD [facility]  01/22/2024, 1:15 AM

## 2024-01-22 NOTE — Consult Note (Signed)
 Tina Wagner Renal Consultation Note  Requesting MD: Jacklynn Mask, MD Indication for Consultation:  hyponatremia   PCP: Darnelle Elders at [facility]  Chief complaint: abnormal labs outpatient   HPI:  Tina Wagner is a 78 y.o. female with little past medical history other than osteopenia and prior tobacco abuse who presented to the ER after outpatient labs demonstrated hyponatremia.  She had her blood pressure checked after what she states was an emotional church service and this was found to be 160/100.  She had then been seen at a walk-in clinic at [facility] for elevated blood pressure and per her labs was directed to the ER.  She reports that her sodium outpatient was 121.  Sodium was initially 121 at [facility] as well.  Urine sodium was 111.  She was transferred to [facility].  On repeat today, Na 125.  She states sodium has been in the low 130's before but never anything like this.  She has been filling up a 16-oz thermos 3-4 times a day and also drinks tea, other liquids.  She drank quite a bit of water over this weekend.  She eats a varied diet - tries to have a low fat high fiber diet but doesn't feel she overly restricts.  She has cereal, fruit and may have yogurt for breakfast.  Then lunch is soup or salad.  Dinner is meat and a vegetable usually.  She does not drink beer.  She does drinks wine sometimes - she never has more than one glass per day.  She recently did buy a bottle of wine and had one glass Thursday - Saturday this past week; then after it's done may go weeks without any.  Her former spouse of 20 years recently passed away; they had been divorced 20 years but this was still understandably jarring and stressful.  She usually goes to 2 exercise classes a week.  She is not aware of active thyroid  disease; at one point her thyroid  was almost biopsied but the lesion in question went away so this wasn't pursued.  She smoked in college for a few years but then stopped shortly  thereafter; less than a pack a day.       Creatinine, Ser  Date/Time Value Ref Range Status  01/22/2024 09:23 AM 0.55 0.44 - 1.00 mg/dL Final  16/06/9603 54:09 PM 0.51 0.44 - 1.00 mg/dL Final     PMHx:   Past Medical History:  Diagnosis Date   Atypical chest pain 12/09/2015   Palpitations 12/09/2015  osteopenia   Family Hx:  Family History  Problem Relation Age of Onset   Osteoporosis Mother    COPD Mother    Heart disease Father    Lung disease Maternal Grandmother    Heart disease Maternal Grandfather     Social History:  reports that she has quit smoking. She has never used smokeless tobacco. No history on file for alcohol use and drug use.  Allergies:  Allergies  Allergen Reactions   Celexa [Citalopram] Other (See Comments)    No emotion    Medications: Prior to Admission medications   Medication Sig Start Date End Date Taking? Authorizing Provider  alendronate (FOSAMAX) 70 MG tablet Take 70 mg by mouth once a week. Saturday 12/19/23  Yes [provider]  Calcium Carb-Cholecalciferol 600-200 MG-UNIT TABS Take 2 tablets by mouth daily at 12 noon.   Yes [provider]  latanoprost (XALATAN) 0.005 % ophthalmic solution Place 1 drop into both eyes at bedtime.  08/01/23  Yes [provider]  meclizine (ANTIVERT) 12.5 MG tablet Take 12.5 mg by mouth 2 (two) times daily as needed for nausea or dizziness.   Yes [provider]  Multiple Vitamins-Minerals (MULTIVITAMIN PO) Take 1 tablet by mouth daily.   Yes [provider]    I have reviewed the patient's current and reported prior to admission medications.  Labs:     Latest Ref Rng & Units 01/22/2024    9:23 AM 01/21/2024    6:49 PM  BMP  Glucose 70 - 99 mg/dL 454  098   BUN 8 - 23 mg/dL 8  8   Creatinine 1.19 - 1.00 mg/dL 1.47  8.29   Sodium 562 - 145 mmol/L 125  121   Potassium 3.5 - 5.1 mmol/L 3.8  4.5   Chloride 98 - 111 mmol/L 93  87   CO2 22 - 32 mmol/L 21  21    Calcium 8.9 - 10.3 mg/dL 9.2  9.3      ROS:  Pertinent items noted in HPI and remainder of comprehensive ROS otherwise negative.  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  No culprit meds.  Asymptomatic and euvolemic.  TSH is acceptable   - Fluid restrict to 1.2 liters a day - Sodium is ordered every 8 hours which is reasonable  - Will order chest xray as well given her hx of remote tobacco abuse - In the absence of culprit meds would recommend age-appropriate cancer screening to start  - Note ACTH stim test is in process as well   # HTN  - Improved without intervention here   Thank you for the consult.  Please do not hesitate to contact me with any questions regarding our patient    Nan Aver 01/22/2024, 4:24 PM

## 2024-01-22 NOTE — Progress Notes (Signed)
 PROGRESS NOTE  Tina Wagner  DOB: 09-23-1945  PCP: Darnelle Elders, PA-C NFA:[id number]  DOA: 01/21/2024  LOS: 0 days  Hospital Day: 2  Brief narrative: Tina Wagner is a 78 y.o. female with no significant PMH other than osteoporosis. Lives at home alone.  Able to ambulate independent 5/19, patient was seen at a walk-in clinic for elevated blood pressure.  Labs showed sodium level low at 121 and hence sent to the ED for further workup.  In the ED, patient was afebrile, hemodynamically stable. Sodium level 121 Admitted to [facility] at [facility] for further workup  Subjective: Patient was seen and examined morning.  Pleasant thin built elderly Caucasian female.  Propped up in bed.  Not in distress. Blood pressure in normal range this morning Labs this morning with sodium better at 125  Assessment and plan: Hyponatremia Appears euvolemic and asymptomatic. Despite low serum sodium level and low serum osmolality, patient has high urine sodium and osmolality. Suspect SIADH Currently in 1200 mL/day fluid restriction Nephrology Dr. Yvonnie Heritage following TSH level normal. Recent Labs  Lab 01/21/24 1849 01/22/24 0923  NA 121* 125*   Elevated blood pressure She was initially noted to have elevated blood pressure to 180s. Currently normal without treatment. Continue to monitor    Mobility: Encourage ambulation  Goals of care   Code Status: Full Code     DVT prophylaxis:  enoxaparin (LOVENOX) injection 40 mg Start: 01/22/24 1000   Antimicrobials: None Fluid: None Consultants: Nephrology Family Communication: None at bedside  Status: Observation Level of care:  Telemetry   Patient is from: Home Needs to continue in-hospital care: Needs sodium level monitoring Anticipated d/c to: Home hopefully in 1 to 2 days    Diet:  Diet Order             Diet regular Room service appropriate? Yes; Fluid consistency: Thin; Fluid restriction: 1200 mL Fluid  Diet effective now                    Scheduled Meds:  enoxaparin (LOVENOX) injection  40 mg Subcutaneous Q24H   sodium chloride flush  3 mL Intravenous Q12H    PRN meds: acetaminophen **OR** acetaminophen, hydrALAZINE, ondansetron **OR** ondansetron (ZOFRAN) IV, senna-docusate   Infusions:    Antimicrobials: Anti-infectives (From admission, onward)    None       Objective: Vitals:   01/22/24 1016 01/22/24 1258  BP: 126/61 131/71  Pulse: 66 63  Resp: 17 16  Temp: 97.9 F (36.6 C) (!) 97.5 F (36.4 C)  SpO2: 98% 96%    Intake/Output Summary (Last 24 hours) at 01/22/2024 1432 Last data filed at 01/22/2024 1020 Gross per 24 hour  Intake 120 ml  Output --  Net 120 ml   Filed Weights   01/21/24 2238  Weight: 65 kg   Weight change:  Body mass index is 24.6 kg/m.   Physical Exam: General exam: Pleasant, elderly Caucasian female.  Thin built Skin: No rashes, lesions or ulcers. HEENT: Atraumatic, normocephalic, no obvious bleeding Lungs: Clear to auscultation bilaterally,  CVS: S1, S2, no murmur,   GI/Abd: Soft, nontender, nondistended, bowel sound present,   CNS: Alert, awake, oriented x 3 Psychiatry: Mood appropriate,  Extremities: No pedal edema, no calf tenderness,   Data Review: I have personally reviewed the laboratory data and studies available.  F/u labs ordered Unresulted Labs (From admission, onward)     Start     Ordered   01/29/24 0500  Creatinine,  serum  (enoxaparin (LOVENOX)    CrCl >/= 30 ml/min)  Weekly,   R     Comments: while on enoxaparin therapy   Question:  Specimen collection method  Answer:  Lab=Lab collect   01/22/24 0114   01/22/24 0500  ACTH stimulation, 3 time points (baseline, 30 min, 60 min)  (cosyntropin (CORTROSYN) test and labs)  Tomorrow morning,   R       Comments: First sample: Draw before administering Cosyntropin Second sample: Draw 30 minutes after administering Cosyntropin Third sample: Draw 60 minutes after administering Cosyntropin.    Question:  Specimen collection method  Answer:  Lab=Lab collect   01/22/24 0114   01/22/24 0500  CBC  Daily,   R     Question:  Specimen collection method  Answer:  Lab=Lab collect   01/22/24 0114   01/22/24 0114  Basic metabolic panel  Now then every 8 hours,   R     Question:  Specimen collection method  Answer:  Lab=Lab collect   01/22/24 0114           Signed, Hoyt Macleod, MD [facility] 01/22/2024

## 2024-01-22 NOTE — Plan of Care (Signed)

## 2024-01-22 NOTE — Care Management Obs Status (Signed)
 MEDICARE OBSERVATION STATUS NOTIFICATION   Patient Details  Name: OLENE GODFREY MRN: [id number] Date of Birth: 04-26-1946   Medicare Observation Status Notification Given:  Yes    Tessie Fila, RN 01/22/2024, 12:27 PM

## 2024-01-23 DIAGNOSIS — E871 Hypo-osmolality and hyponatremia: Secondary | ICD-10-CM | POA: Diagnosis not present

## 2024-01-23 LAB — CBC
HCT: 37.2 % (ref 36.0–46.0)
Hemoglobin: 12.6 g/dL (ref 12.0–15.0)
MCH: 29.6 pg (ref 26.0–34.0)
MCHC: 33.9 g/dL (ref 30.0–36.0)
MCV: 87.5 fL (ref 80.0–100.0)
Platelets: 296 10*3/uL (ref 150–400)
RBC: 4.25 MIL/uL (ref 3.87–5.11)
RDW: 11.9 % (ref 11.5–15.5)
WBC: 5.7 10*3/uL (ref 4.0–10.5)
nRBC: 0 % (ref 0.0–0.2)

## 2024-01-23 LAB — HEPATIC FUNCTION PANEL
ALT: 15 U/L (ref 0–44)
AST: 19 U/L (ref 15–41)
Albumin: 3.5 g/dL (ref 3.5–5.0)
Alkaline Phosphatase: 43 U/L (ref 38–126)
Bilirubin, Direct: <0.1 mg/dL (ref 0.0–0.2)
Total Bilirubin: 0.7 mg/dL (ref 0.0–1.2)
Total Protein: 6.7 g/dL (ref 6.5–8.1)

## 2024-01-23 LAB — BASIC METABOLIC PANEL WITH GFR
Anion gap: 9 (ref 5–15)
Anion gap: 6 (ref 5–15)
BUN: 14 mg/dL (ref 8–23)
BUN: 17 mg/dL (ref 8–23)
CO2: 22 mmol/L (ref 22–32)
CO2: 27 mmol/L (ref 22–32)
Calcium: 8.8 mg/dL — ABNORMAL LOW (ref 8.9–10.3)
Calcium: 8.9 mg/dL (ref 8.9–10.3)
Chloride: 97 mmol/L — ABNORMAL LOW (ref 98–111)
Chloride: 98 mmol/L (ref 98–111)
Creatinine, Ser: 0.31 mg/dL — ABNORMAL LOW (ref 0.44–1.00)
Creatinine, Ser: 0.57 mg/dL (ref 0.44–1.00)
GFR, Estimated: >60 mL/min (ref >60)
GFR, Estimated: >60 mL/min (ref >60)
Glucose, Bld: 86 mg/dL (ref 70–99)
Glucose, Bld: 116 mg/dL — ABNORMAL HIGH (ref 70–99)
Potassium: 3.8 mmol/L (ref 3.5–5.1)
Potassium: 3.8 mmol/L (ref 3.5–5.1)
Sodium: 128 mmol/L — ABNORMAL LOW (ref 135–145)
Sodium: 131 mmol/L — ABNORMAL LOW (ref 135–145)

## 2024-01-23 LAB — CORTISOL: Cortisol, Plasma: 3.1 ug/dL

## 2024-01-23 MED ORDER — AMLODIPINE BESYLATE 5 MG PO TABS
2.5000 mg | ORAL_TABLET | Freq: Every evening | ORAL | Status: DC
  Administered 2024-01-23: 2.5 mg via ORAL
  Filled 2024-01-23: qty 1

## 2024-01-23 MED ORDER — AMLODIPINE BESYLATE 2.5 MG PO TABS: 2.5000 mg | ORAL_TABLET | Freq: Every evening | ORAL | 0 refills | Status: AC

## 2024-01-23 NOTE — Plan of Care (Signed)

## 2024-01-23 NOTE — Discharge Summary (Signed)
 Physician Discharge Summary  Tina Wagner:[id number] DOB: 10-10-1945 DOA: 01/21/2024  PCP: Darnelle Elders, PA-C  Admit date: 01/21/2024 Discharge date: 01/23/2024  Admitted From: Home Discharge disposition: Home  Recommendations at discharge:  You have been started on amlodipine 2.5 mg daily. Plz follow up with your PCP for further monitoring and adjustment.    Brief narrative: Tina Wagner is a 78 y.o. female with no significant PMH other than osteoporosis. Lives at home alone.  Able to ambulate independent 5/19, patient was seen at a walk-in clinic for elevated blood pressure.  Labs showed sodium level low at 121 and hence sent to the ED for further workup.  In the ED, patient was afebrile, hemodynamically stable. Sodium level 121 Admitted to [facility] at [facility] for further workup  Subjective: Patient was seen and examined morning.  Pleasant thin built elderly Caucasian female.  Propped up in bed.  Not in distress.  Hospital course: Hyponatremia Appears euvolemic and asymptomatic. Despite low serum sodium level and low serum osmolality, patient has high urine sodium and osmolality. 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  Cortisol level more than 18 at both 30 minutes and 60 minutes mark -rendering negative result Currently in 1200 mL/day fluid restriction Seen by nephrology Dr. Jennell Moccasin.  Sodium level improved to 131 this afternoon. Recent Labs  Lab 01/21/24 1849 01/22/24 0923 01/22/24 1737 01/23/24 0633 01/23/24 1606  NA 121* 125* 128* 128* 131*   Elevated blood pressure Patient is to try amlodipine 2.5 mg daily.  Prescription sent  Independently able to ambulate at baseline   Goals of care   Code Status: Prior   Diet:  Diet Order             Diet general                   Nutritional status:  Body mass index is 24.6 kg/m.       Wounds:  -    Discharge Exam:   Vitals:   01/22/24 1258 01/22/24 2200 01/23/24 0523 01/23/24  1350  BP: 131/71 120/66 130/75 (!) 153/67  Pulse: 63 69 (!) 57 71  Resp: 16 18 18 20   Temp: (!) 97.5 F (36.4 C) 97.8 F (36.6 C) 97.8 F (36.6 C) 98.2 F (36.8 C)  TempSrc: Oral Oral Oral Oral  SpO2: 96% 97% 97% 97%  Weight:      Height:        Body mass index is 24.6 kg/m.   General exam: Pleasant, elderly Caucasian female.  Thin built Skin: No rashes, lesions or ulcers. HEENT: Atraumatic, normocephalic, no obvious bleeding Lungs: Clear to auscultation bilaterally,  CVS: S1, S2, no murmur,   GI/Abd: Soft, nontender, nondistended, bowel sound present,   CNS: Alert, awake, oriented x 3 Psychiatry: Mood appropriate,  Extremities: No pedal edema, no calf tenderness,   Follow ups:    Discharge Instructions:   Discharge Instructions     Call MD for:  difficulty breathing, headache or visual disturbances   Complete by: As directed    Call MD for:  extreme fatigue   Complete by: As directed    Call MD for:  hives   Complete by: As directed    Call MD for:  persistant dizziness or light-headedness   Complete by: As directed    Call MD for:  persistant nausea and vomiting   Complete by: As directed    Call MD for:  severe uncontrolled pain   Complete by: As  directed    Call MD for:  temperature >100.4   Complete by: As directed    Diet general   Complete by: As directed    Discharge instructions   Complete by: As directed    Recommendations at discharge:   You have been started on amlodipine 2.5 mg daily. Plz follow up with your PCP for further monitoring and adjustment.  General discharge instructions: Follow with Primary MD Darnelle Elders, PA-C in 7 days  Please request your PCP  to go over your hospital tests, procedures, radiology results at the follow up. Please get your medicines reviewed and adjusted.  Your PCP may decide to repeat certain labs or tests as needed. Do not drive, operate heavy machinery, perform activities at heights, swimming or  participation in water activities or provide baby sitting services if your were admitted for syncope or siezures until you have seen by Primary MD or a Neurologist and advised to do so again. [place]  Controlled Substance Reporting System database was reviewed. Do not drive, operate heavy machinery, perform activities at heights, swim, participate in water activities or provide baby-sitting services while on medications for pain, sleep and mood until your outpatient physician has reevaluated you and advised to do so again.  You are strongly recommended to comply with the dose, frequency and duration of prescribed medications. Activity: As tolerated with Full fall precautions use walker/cane & assistance as needed Avoid using any recreational substances like cigarette, tobacco, alcohol, or non-prescribed drug. If you experience worsening of your admission symptoms, develop shortness of breath, life threatening emergency, suicidal or homicidal thoughts you must seek medical attention immediately by calling 911 or calling your MD immediately  if symptoms less severe. You must read complete instructions/literature along with all the possible adverse reactions/side effects for all the medicines you take and that have been prescribed to you. Take any new medicine only after you have completely understood and accepted all the possible adverse reactions/side effects.  Wear Seat belts while driving. You were cared for by a hospitalist during your hospital stay. If you have any questions about your discharge medications or the care you received while you were in the hospital after you are discharged, you can call the unit and ask to speak with the hospitalist or the covering physician. Once you are discharged, your primary care physician will handle any further medical issues. Please note that NO REFILLS for any discharge medications will be authorized once you are discharged, as it is imperative that you return  to your primary care physician (or establish a relationship with a primary care physician if you do not have one).   Increase activity slowly   Complete by: As directed        Discharge Medications:   Allergies as of 01/23/2024       Reactions   Celexa [citalopram] Other (See Comments)   No emotion        Medication List     TAKE these medications    alendronate 70 MG tablet Commonly known as: FOSAMAX Take 70 mg by mouth once a week. Saturday   amLODipine 2.5 MG tablet Commonly known as: NORVASC Take 1 tablet (2.5 mg total) by mouth every evening.   Calcium Carb-Cholecalciferol 600-200 MG-UNIT Tabs Take 2 tablets by mouth daily at 12 noon.   latanoprost 0.005 % ophthalmic solution Commonly known as: XALATAN Place 1 drop into both eyes at bedtime.   meclizine 12.5 MG tablet Commonly known as: ANTIVERT Take 12.5 mg  by mouth 2 (two) times daily as needed for nausea or dizziness.   MULTIVITAMIN PO Take 1 tablet by mouth daily.         The results of significant diagnostics from this hospitalization (including imaging, microbiology, ancillary and laboratory) are listed below for reference.    Procedures and Diagnostic Studies:   DG Chest 2 View Result Date: 01/22/2024 CLINICAL DATA:  98519 Hyponatremia 16109 EXAM: CHEST - 2 VIEW COMPARISON:  None available. FINDINGS: No focal airspace consolidation, pleural effusion, or pneumothorax. No cardiomegaly. Aortic atherosclerosis. No acute fracture or destructive lesion. IMPRESSION: No acute cardiopulmonary abnormality. Electronically Signed   By: Rance Burrows M.D.   On: 01/22/2024 20:19     Labs:   Basic Metabolic Panel: Recent Labs  Lab 01/21/24 1849 01/22/24 0923 01/22/24 1737 01/23/24 0633 01/23/24 1606  NA 121* 125* 128* 128* 131*  K 4.5 3.8 4.4 3.8 3.8  CL 87* 93* 95* 97* 98  CO2 21* 21* 20* 22 27  GLUCOSE 109* 105* 121* 86 116*  BUN 8 8 12 14 17   CREATININE 0.51 0.55 0.63 0.31* 0.57  CALCIUM  9.3 9.2 8.9 8.8* 8.9  MG 2.0  --   --   --   --    GFR Estimated Creatinine Clearance: 52.5 mL/min (by C-G formula based on SCr of 0.57 mg/dL). Liver Function Tests: Recent Labs  Lab 01/23/24 1606  AST 19  ALT 15  ALKPHOS 43  BILITOT 0.7  PROT 6.7  ALBUMIN 3.5   No results for input(s): "LIPASE", "AMYLASE" in the last 168 hours. No results for input(s): "AMMONIA" in the last 168 hours. Coagulation profile No results for input(s): "INR", "PROTIME" in the last 168 hours.  CBC: Recent Labs  Lab 01/21/24 1849 01/22/24 0923 01/23/24 0633  WBC 6.6 4.4 5.7  HGB 12.2 12.9 12.6  HCT 35.0* 38.2 37.2  MCV 84.7 87.6 87.5  PLT 308 318 296   Cardiac Enzymes: No results for input(s): "CKTOTAL", "CKMB", "CKMBINDEX", "TROPONINI" in the last 168 hours. BNP: Invalid input(s): "POCBNP" CBG: No results for input(s): "GLUCAP" in the last 168 hours. D-Dimer No results for input(s): "DDIMER" in the last 72 hours. Hgb A1c No results for input(s): "HGBA1C" in the last 72 hours. Lipid Profile No results for input(s): "CHOL", "HDL", "LDLCALC", "TRIG", "CHOLHDL", "LDLDIRECT" in the last 72 hours. Thyroid  function studies No results for input(s): "TSH", "T4TOTAL", "T3FREE", "THYROIDAB" in the last 72 hours.  Invalid input(s): "FREET3" Anemia work up No results for input(s): "VITAMINB12", "FOLATE", "FERRITIN", "TIBC", "IRON", "RETICCTPCT" in the last 72 hours. Microbiology No results found for this or any previous visit (from the past 240 hours).  Time coordinating discharge: 45 minutes  Signed: Olie Scaffidi  [facility] 01/25/2024, 10:18 AM

## 2024-01-23 NOTE — Progress Notes (Addendum)
 PROGRESS NOTE  Tina Wagner  DOB: 1945/10/07  PCP: Darnelle Elders, PA-C ZOX:[id number]  DOA: 01/21/2024  LOS: 1 day  Hospital Day: 3  Brief narrative: Tina Wagner is a 78 y.o. female with no significant PMH other than osteoporosis. Lives at home alone.  Able to ambulate independent 5/19, patient was seen at a walk-in clinic for elevated blood pressure.  Labs showed sodium level low at 121 and hence sent to the ED for further workup.  In the ED, patient was afebrile, hemodynamically stable. Sodium level 121 Admitted to TRH at [facility] for further workup  Subjective: Patient was seen and examined morning.  Pleasant thin built elderly Caucasian female.  Propped up in bed.  Not in distress. Labs this morning with sodium better at 128  Assessment and plan: Hyponatremia Appears euvolemic and asymptomatic. Despite low serum sodium level and low serum osmolality, patient has high urine sodium and osmolality. 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  Cortisol level more than 18 at both 30 minutes and 60 minutes mark -rendering negative result Currently in 1200 mL/day fluid restriction Nephrology Dr. Yvonnie Heritage following Sodium level better at 128 this morning.  Discussed with nephrology Dr. Yvonnie Heritage.  Considering 1 dose of tolvaptan. Hepatic function panel added.  Hold Recent Labs  Lab 01/21/24 1849 01/22/24 0923 01/22/24 1737 01/23/24 0633  NA 121* 125* 128* 128*   Elevated blood pressure She was initially noted to have elevated blood pressure to 180s. Currently normal without treatment. Continue to monitor    Mobility: Encourage ambulation  Goals of care   Code Status: Full Code     DVT prophylaxis:  enoxaparin (LOVENOX) injection 40 mg Start: 01/22/24 1000   Antimicrobials: None Fluid: None Consultants: Nephrology Family Communication: None at bedside  Status: Observation Level of care:  Telemetry   Patient is from: Home Needs to continue in-hospital  care: Needs sodium level monitoring Anticipated d/c to: Home hopefully in 1 to 2 days    Diet:  Diet Order             Diet regular Room service appropriate? Yes; Fluid consistency: Thin; Fluid restriction: 1200 mL Fluid  Diet effective now                   Scheduled Meds:  enoxaparin (LOVENOX) injection  40 mg Subcutaneous Q24H   sodium chloride flush  3 mL Intravenous Q12H    PRN meds: acetaminophen **OR** acetaminophen, hydrALAZINE, ondansetron **OR** ondansetron (ZOFRAN) IV, senna-docusate   Infusions:    Antimicrobials: Anti-infectives (From admission, onward)    None       Objective: Vitals:   01/23/24 0523 01/23/24 1350  BP: 130/75 (!) 153/67  Pulse: (!) 57 71  Resp: 18 20  Temp: 97.8 F (36.6 C) 98.2 F (36.8 C)  SpO2: 97% 97%    Intake/Output Summary (Last 24 hours) at 01/23/2024 1601 Last data filed at 01/23/2024 1400 Gross per 24 hour  Intake 720 ml  Output --  Net 720 ml   Filed Weights   01/21/24 2238 01/22/24 0800  Weight: 65 kg 65 kg   Weight change: 0 kg Body mass index is 24.6 kg/m.   Physical Exam: General exam: Pleasant, elderly Caucasian female.  Thin built Skin: No rashes, lesions or ulcers. HEENT: Atraumatic, normocephalic, no obvious bleeding Lungs: Clear to auscultation bilaterally,  CVS: S1, S2, no murmur,   GI/Abd: Soft, nontender, nondistended, bowel sound present,   CNS: Alert, awake, oriented x 3 Psychiatry: Mood  appropriate,  Extremities: No pedal edema, no calf tenderness,   Data Review: I have personally reviewed the laboratory data and studies available.  F/u labs ordered Unresulted Labs (From admission, onward)     Start     Ordered   01/29/24 0500  Creatinine, serum  (enoxaparin (LOVENOX)    CrCl >/= 30 ml/min)  Weekly,   R     Comments: while on enoxaparin therapy   Question:  Specimen collection method  Answer:  Lab=Lab collect   01/22/24 0114   01/23/24 1554  Hepatic function panel  Add-on,   AD        Question:  Specimen collection method  Answer:  Lab=Lab collect   01/23/24 1553   01/22/24 0500  CBC  Daily,   R     Question:  Specimen collection method  Answer:  Lab=Lab collect   01/22/24 0114           Signed, Hoyt Macleod, MD [facility] 01/23/2024

## 2024-01-23 NOTE — Plan of Care (Signed)

## 2024-01-23 NOTE — Progress Notes (Signed)
 [facility] Progress Note  Name: Tina Wagner MRN: [id number] DOB: 06-19-1946  Chief Complaint:  Abnormal labs and HTN  Subjective:  Feels well today.  Glad to hear about sodium improving.  We called her son on the phone and attempted to call her daughter on the phone.  Her blood pressure was elevated again today to the 150's.  She is comfortable with starting a BP medication.  She will continue the fluid restriction.  She states that her PCP has screened for DM - would ensure this is up to date as I do not have access to those labs.    Review of systems:  Denies shortness of breath or chest pain  Denies n/v  -------------------- Background on consult:  Tina Wagner is a 78 y.o. female with little past medical history other than osteopenia and prior tobacco abuse who presented to the ER after outpatient labs demonstrated hyponatremia.  She had her blood pressure checked after what she states was an emotional church service and this was found to be 160/100.  She had then been seen at a walk-in clinic at [facility] for elevated blood pressure and per her labs was directed to the ER.  She reports that her sodium outpatient was 121.  Sodium was initially 121 at [facility] as well.  Urine sodium was 111.  She was transferred to [facility].  On repeat today, Na 125.  She states sodium has been in the low 130's before but never anything like this.  She has been filling up a 16-oz thermos 3-4 times a day and also drinks tea, other liquids.  She drank quite a bit of water over this weekend.  She eats a varied diet - tries to have a low fat high fiber diet but doesn't feel she overly restricts.  She has cereal, fruit and may have yogurt for breakfast.  Then lunch is soup or salad.  Dinner is meat and a vegetable usually.  She does not drink beer.  She does drinks wine sometimes - she never has more than one glass per day.  She recently did buy a bottle of wine and had one glass Thursday -  Saturday this past week; then after it's done may go weeks without any.  Her former spouse of 20 years recently passed away; they had been divorced 20 years but this was still understandably jarring and stressful.  She usually goes to 2 exercise classes a week.  She is not aware of active thyroid  disease; at one point her thyroid  was almost biopsied but the lesion in question went away so this wasn't pursued.  She smoked in college for a few years but then stopped shortly thereafter; less than a pack a day.         Intake/Output Summary (Last 24 hours) at 01/23/2024 1836 Last data filed at 01/23/2024 1400 Gross per 24 hour  Intake 480 ml  Output --  Net 480 ml    Vitals:  Vitals:   01/22/24 1258 01/22/24 2200 01/23/24 0523 01/23/24 1350  BP: 131/71 120/66 130/75 (!) 153/67  Pulse: 63 69 (!) 57 71  Resp: 16 18 18 20   Temp: (!) 97.5 F (36.4 C) 97.8 F (36.6 C) 97.8 F (36.6 C) 98.2 F (36.8 C)  TempSrc: Oral Oral Oral Oral  SpO2: 96% 97% 97% 97%  Weight:      Height:         Physical Exam:  General adult female in bed in no  acute distress HEENT normocephalic atraumatic extraocular movements intact sclera anicteric Neck supple trachea midline Lungs clear to auscultation bilaterally normal work of breathing at rest  Heart S1S2 no rub Abdomen soft nontender nondistended Extremities no edema  Psych normal mood and affect Neuro alert and oriented x 3 provides hx and follows commands   Medications reviewed   Labs:     Latest Ref Rng & Units 01/23/2024    4:06 PM 01/23/2024    6:33 AM 01/22/2024    5:37 PM  BMP  Glucose 70 - 99 mg/dL 161  86  096   BUN 8 - 23 mg/dL 17  14  12    Creatinine 0.44 - 1.00 mg/dL 0.45  4.09  8.11   Sodium 135 - 145 mmol/L 131  128  128   Potassium 3.5 - 5.1 mmol/L 3.8  3.8  4.4   Chloride 98 - 111 mmol/L 98  97  95   CO2 22 - 32 mmol/L 27  22  20    Calcium 8.9 - 10.3 mg/dL 8.9  8.8  8.9      Assessment/Plan:   # 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  No culprit meds.  Asymptomatic and euvolemic.  TSH is acceptable  ACTH stimulation test results acceptable.  CXR without acute process.  She has responded well to fluid restriction - Fluid restrict to 1.2 liters a day - to continue at home - We have discussed that in the absence of culprit meds would recommend age-appropriate cancer screening - Note ACTH stim test is in process as well    # HTN  - Start amlodipine 2.5 mg nightly   Stable for discharge from a nephrology standpoint and she is comfortable with this as well.  Reached out to primary team with recs above  Will set up follow-up with me in one week   Thank you for the consult.  Please do not hesitate to contact me with any questions regarding our patient   Nan Aver, MD 01/23/2024 6:45 PM

## 2024-01-30 DIAGNOSIS — N179 Acute kidney failure, unspecified: Secondary | ICD-10-CM | POA: Diagnosis not present

## 2024-01-31 DIAGNOSIS — E871 Hypo-osmolality and hyponatremia: Secondary | ICD-10-CM | POA: Diagnosis not present

## 2024-01-31 DIAGNOSIS — I10 Essential (primary) hypertension: Secondary | ICD-10-CM | POA: Diagnosis not present

## 2024-01-31 DIAGNOSIS — E222 Syndrome of inappropriate secretion of antidiuretic hormone: Secondary | ICD-10-CM | POA: Diagnosis not present

## 2024-01-31 DIAGNOSIS — F430 Acute stress reaction: Secondary | ICD-10-CM | POA: Diagnosis not present

## 2024-01-31 DIAGNOSIS — Z6824 Body mass index (BMI) 24.0-24.9, adult: Secondary | ICD-10-CM | POA: Diagnosis not present

## 2024-02-01 DIAGNOSIS — H2513 Age-related nuclear cataract, bilateral: Secondary | ICD-10-CM | POA: Diagnosis not present

## 2024-02-02 DIAGNOSIS — E7800 Pure hypercholesterolemia, unspecified: Secondary | ICD-10-CM | POA: Diagnosis not present

## 2024-02-22 ENCOUNTER — Other Ambulatory Visit: Payer: Self-pay | Admitting: Nephrology

## 2024-02-22 DIAGNOSIS — E871 Hypo-osmolality and hyponatremia: Secondary | ICD-10-CM

## 2024-02-22 DIAGNOSIS — Z87891 Personal history of nicotine dependence: Secondary | ICD-10-CM

## 2024-02-28 ENCOUNTER — Ambulatory Visit
Admission: RE | Admit: 2024-02-28 | Discharge: 2024-02-28 | Disposition: A | Discharge status: Home / Self Care | Source: Ambulatory Visit | Attending: Nephrology | Admitting: Nephrology

## 2024-02-28 DIAGNOSIS — I700 Atherosclerosis of aorta: Secondary | ICD-10-CM | POA: Diagnosis not present

## 2024-02-28 DIAGNOSIS — E871 Hypo-osmolality and hyponatremia: Secondary | ICD-10-CM | POA: Diagnosis not present

## 2024-02-28 DIAGNOSIS — Z87891 Personal history of nicotine dependence: Secondary | ICD-10-CM

## 2024-03-04 DIAGNOSIS — E871 Hypo-osmolality and hyponatremia: Secondary | ICD-10-CM | POA: Diagnosis not present

## 2024-03-04 DIAGNOSIS — N179 Acute kidney failure, unspecified: Secondary | ICD-10-CM | POA: Diagnosis not present

## 2024-04-03 DIAGNOSIS — E7800 Pure hypercholesterolemia, unspecified: Secondary | ICD-10-CM | POA: Diagnosis not present

## 2024-04-16 DIAGNOSIS — H26493 Other secondary cataract, bilateral: Secondary | ICD-10-CM | POA: Diagnosis not present

## 2024-04-21 DIAGNOSIS — E7800 Pure hypercholesterolemia, unspecified: Secondary | ICD-10-CM | POA: Diagnosis not present

## 2024-04-21 DIAGNOSIS — Z6824 Body mass index (BMI) 24.0-24.9, adult: Secondary | ICD-10-CM | POA: Diagnosis not present

## 2024-04-21 DIAGNOSIS — E559 Vitamin D deficiency, unspecified: Secondary | ICD-10-CM | POA: Diagnosis not present

## 2024-04-21 DIAGNOSIS — K6289 Other specified diseases of anus and rectum: Secondary | ICD-10-CM | POA: Diagnosis not present

## 2024-04-21 DIAGNOSIS — E871 Hypo-osmolality and hyponatremia: Secondary | ICD-10-CM | POA: Diagnosis not present

## 2024-04-28 DIAGNOSIS — E7800 Pure hypercholesterolemia, unspecified: Secondary | ICD-10-CM | POA: Diagnosis not present

## 2024-04-28 DIAGNOSIS — E871 Hypo-osmolality and hyponatremia: Secondary | ICD-10-CM | POA: Diagnosis not present

## 2024-04-28 DIAGNOSIS — E559 Vitamin D deficiency, unspecified: Secondary | ICD-10-CM | POA: Diagnosis not present

## 2024-05-04 DIAGNOSIS — E7800 Pure hypercholesterolemia, unspecified: Secondary | ICD-10-CM | POA: Diagnosis not present

## 2024-05-07 DIAGNOSIS — G43909 Migraine, unspecified, not intractable, without status migrainosus: Secondary | ICD-10-CM | POA: Diagnosis not present

## 2024-05-07 DIAGNOSIS — E559 Vitamin D deficiency, unspecified: Secondary | ICD-10-CM | POA: Diagnosis not present

## 2024-05-07 DIAGNOSIS — Z6823 Body mass index (BMI) 23.0-23.9, adult: Secondary | ICD-10-CM | POA: Diagnosis not present

## 2024-05-07 DIAGNOSIS — I2510 Atherosclerotic heart disease of native coronary artery without angina pectoris: Secondary | ICD-10-CM | POA: Diagnosis not present

## 2024-05-07 DIAGNOSIS — E871 Hypo-osmolality and hyponatremia: Secondary | ICD-10-CM | POA: Diagnosis not present

## 2024-05-07 DIAGNOSIS — Z0000 Encounter for general adult medical examination without abnormal findings: Secondary | ICD-10-CM | POA: Diagnosis not present

## 2024-05-07 DIAGNOSIS — I10 Essential (primary) hypertension: Secondary | ICD-10-CM | POA: Diagnosis not present

## 2024-05-07 DIAGNOSIS — Z1211 Encounter for screening for malignant neoplasm of colon: Secondary | ICD-10-CM | POA: Diagnosis not present

## 2024-05-07 DIAGNOSIS — M8588 Other specified disorders of bone density and structure, other site: Secondary | ICD-10-CM | POA: Diagnosis not present

## 2024-05-07 DIAGNOSIS — E7800 Pure hypercholesterolemia, unspecified: Secondary | ICD-10-CM | POA: Diagnosis not present

## 2024-05-07 DIAGNOSIS — I700 Atherosclerosis of aorta: Secondary | ICD-10-CM | POA: Diagnosis not present

## 2024-05-08 DIAGNOSIS — Z23 Encounter for immunization: Secondary | ICD-10-CM | POA: Diagnosis not present

## 2024-05-15 DIAGNOSIS — H26493 Other secondary cataract, bilateral: Secondary | ICD-10-CM | POA: Diagnosis not present

## 2024-05-16 DIAGNOSIS — R194 Change in bowel habit: Secondary | ICD-10-CM | POA: Diagnosis not present

## 2024-05-16 DIAGNOSIS — R159 Full incontinence of feces: Secondary | ICD-10-CM | POA: Diagnosis not present

## 2024-05-16 DIAGNOSIS — A630 Anogenital (venereal) warts: Secondary | ICD-10-CM | POA: Diagnosis not present

## 2024-05-16 DIAGNOSIS — K5900 Constipation, unspecified: Secondary | ICD-10-CM | POA: Diagnosis not present

## 2024-05-16 DIAGNOSIS — K623 Rectal prolapse: Secondary | ICD-10-CM | POA: Diagnosis not present

## 2024-06-03 DIAGNOSIS — Z23 Encounter for immunization: Secondary | ICD-10-CM | POA: Diagnosis not present

## 2024-06-03 DIAGNOSIS — E7800 Pure hypercholesterolemia, unspecified: Secondary | ICD-10-CM | POA: Diagnosis not present

## 2024-06-04 DIAGNOSIS — R159 Full incontinence of feces: Secondary | ICD-10-CM | POA: Diagnosis not present

## 2024-06-30 DIAGNOSIS — L821 Other seborrheic keratosis: Secondary | ICD-10-CM | POA: Diagnosis not present

## 2024-06-30 DIAGNOSIS — L814 Other melanin hyperpigmentation: Secondary | ICD-10-CM | POA: Diagnosis not present

## 2024-06-30 DIAGNOSIS — D225 Melanocytic nevi of trunk: Secondary | ICD-10-CM | POA: Diagnosis not present

## 2024-06-30 DIAGNOSIS — D2262 Melanocytic nevi of left upper limb, including shoulder: Secondary | ICD-10-CM | POA: Diagnosis not present

## 2024-06-30 DIAGNOSIS — L578 Other skin changes due to chronic exposure to nonionizing radiation: Secondary | ICD-10-CM | POA: Diagnosis not present

## 2024-06-30 DIAGNOSIS — L720 Epidermal cyst: Secondary | ICD-10-CM | POA: Diagnosis not present

## 2024-07-04 DIAGNOSIS — E7800 Pure hypercholesterolemia, unspecified: Secondary | ICD-10-CM | POA: Diagnosis not present

## 2024-07-17 DIAGNOSIS — E7800 Pure hypercholesterolemia, unspecified: Secondary | ICD-10-CM | POA: Diagnosis not present

## 2024-07-21 ENCOUNTER — Encounter: Payer: Self-pay | Admitting: Physical Therapy

## 2024-07-21 ENCOUNTER — Ambulatory Visit: Admitting: Physical Therapy

## 2024-07-21 ENCOUNTER — Other Ambulatory Visit: Payer: Self-pay

## 2024-07-21 DIAGNOSIS — R293 Abnormal posture: Secondary | ICD-10-CM | POA: Diagnosis not present

## 2024-07-21 DIAGNOSIS — R279 Unspecified lack of coordination: Secondary | ICD-10-CM | POA: Insufficient documentation

## 2024-07-21 DIAGNOSIS — M6281 Muscle weakness (generalized): Secondary | ICD-10-CM | POA: Insufficient documentation

## 2024-07-21 NOTE — Therapy (Unsigned)
 OUTPATIENT PHYSICAL THERAPY FEMALE PELVIC EVALUATION   Patient Name: Tina Wagner MRN: [id number] DOB:July 10, 1946, 78 y.o., female Today's Date: 07/22/2024  END OF SESSION:  PT End of Session - 07/21/24 1014     Visit Number 1    Number of Visits 12    Date for Recertification  10/21/24    Authorization Type medicare    PT Start Time 0932    PT Stop Time 1014    PT Time Calculation (min) 42 min    Activity Tolerance Patient tolerated treatment well    Behavior During Therapy [facility] for tasks assessed/performed          Past Medical History:  Diagnosis Date   Atypical chest pain 12/09/2015   Palpitations 12/09/2015   History reviewed. No pertinent surgical history. Patient Active Problem List   Diagnosis Date Noted   Elevated blood pressure 01/22/2024   Acute hyponatremia 01/22/2024   Hyponatremia 01/21/2024   Atypical chest pain 12/09/2015   Palpitations 12/09/2015    PCP: Katina Pfeiffer, PA-C   REFERRING PROVIDER: Flipping, Leonette RIGGERS   REFERRING DIAG: K62.3 (ICD-10-CM) - Rectal prolapse  THERAPY DIAG:  Muscle weakness (generalized)  Abnormal posture  Unspecified lack of coordination  Rationale for Evaluation and Treatment: Rehabilitation  ONSET DATE: at least 3 years ago.   SUBJECTIVE:                                                                                                                                                                                           SUBJECTIVE STATEMENT: I feel like something is leaving my body and I want to hold it in. Sometimes have fecal incontinence with walking and this makes her nervous and embarrassed and she limits activity because of this. Also has fecal leakage with activity in general no concerns with sitting. Any abdominal exercises as well. Usually small fecal amounts of leakage, and very soft.   Also feels like prolapse comes out with stool for bowel movements. Also struggles with emptying bladder until  emptying bowels sometimes.   Fluid intake: water - decreased water due to sodium levels but has been rechecked and sodium is increasing but now able to drink ~64 oz of water daily.   FUNCTIONAL LIMITATIONS: all exercises, walking, bowel movements, emptying bladder  PERTINENT HISTORY:  Medications for current condition: no Surgeries: no Other: no Sexual abuse: No  PAIN:  Are you having pain? No   PRECAUTIONS: None  RED FLAGS: None   WEIGHT BEARING RESTRICTIONS: No  FALLS:  Has patient fallen in last 6 months? No  OCCUPATION: retired   ACTIVITY LEVEL : low- mod  PLOF: Independent  PATIENT GOALS: to have more regular bowel movements and no leakage     BOWEL MOVEMENT: Pain with bowel movement: No Type of bowel movement:Type (Bristol Stool Scale) 4-5, Frequency every other day, Strain sometimes, and Splinting to return prolapse in Fully empty rectum: No Leakage: Yes: activity, walking, exercises                                                 Bowel urgency: occasionally with medication  Pads: Yes: liner AAT during day Fiber supplement/laxative linzess   URINATION: Pain with urination: No Fully empty bladder: Yes: but sometimes difficult to start with bowel movement                                          Post-void dribble: No Stream: Strong Urgency: No Frequency:around every 2 hours                                                        Nocturia: Nomaybe once occasionally    Leakage: if has a very full bladder and then coughs/sneezes/laughs wil have small amt Pads/briefs:   INTERCOURSE:  Ability to have vaginal penetration No    PREGNANCY: Vaginal deliveries 3 Tearing No Episiotomy Yes - first  C-section deliveries .0 Currently pregnant No  PROLAPSE: Rectally    OBJECTIVE:  Note: Objective measures were completed at Evaluation unless otherwise noted.  DIAGNOSTIC FINDINGS:    COGNITION: Overall cognitive status: Within functional limits for  tasks assessed     SENSATION: Light touch: Appears intact  LUMBAR SPECIAL TESTS:  SI Compression/distraction test: Negative  FUNCTIONAL TESTS:   Single leg stance:instability bil mild hip drop    Sit-up test: reports feeling like she may need to have bowel movement, but no leakage  Squat: bil knee valgus and decreased descent by 50%  GAIT: WFL   POSTURE: rounded shoulders   LUMBARAROM/PROM:  A/PROM A/PROM  Eval (% available)  Flexion 100  Extension 100  Right lateral flexion 75  Left lateral flexion 75  Right rotation 75  Left rotation 75   (Blank rows = not tested)  LOWER EXTREMITY ROM:  Bil hamstrings and adductors limited by 25%  LOWER EXTREMITY MMT:  Bil hip strength grossly 4/5 PALPATION:  General: tightness in lumbar paraspinals bil   Pelvic Alignment: WFL  Abdominal: tightness in lower abdominal quadrants                 External Perineal Exam: pt deferred                              Internal Pelvic Floor: pt deferred   Patient confirms identification and approves PT to assess internal pelvic floor and treatment No All internal or external pelvic floor assessments and/or treatments are completed with proper hand hygiene and gloves hands. If needed gloves are changed with hand hygiene during patient care time.  PELVIC MMT:   MMT eval  Vaginal   Internal Anal Sphincter   External Anal Sphincter  Puborectalis   (Blank rows = not tested)        TONE: Deferred   PROLAPSE: Deferred   TODAY'S TREATMENT:                                                                                                                              DATE:   07/21/24 EVAL Examination completed, findings reviewed, pt educated on POC, HEP, and educated on abdominal massage, voiding mechanics, and fiber types. Pt motivated to participate in PT and agreeable to attempt recommendations.     PATIENT EDUCATION:  Education details: [id number] Person educated:  Patient Education method: Explanation, Demonstration, Tactile cues, Verbal cues, and Handouts Education comprehension: verbalized understanding, returned demonstration, verbal cues required, tactile cues required, and needs further education  HOME EXERCISE PROGRAM: [id number], abdominal massage, voiding mechanics, and fiber types  ASSESSMENT:  CLINICAL IMPRESSION: Patient is a 78 y.o. female  who was seen today for physical therapy evaluation and treatment for rectal prolapse. Pt history of chronic constipation. Pt found to have decreased flexibility in spine and hips bil, slightly decreased strength in hips and core. Pt deferred internal assessment today. Pt does report deficits with bowel and bladder emptying fully and sometimes leakage. Pt would benefit from additional PT to further address deficits.    OBJECTIVE IMPAIRMENTS: decreased activity tolerance, decreased coordination, decreased endurance, decreased mobility, decreased strength, increased fascial restrictions, impaired flexibility, improper body mechanics, and postural dysfunction.   ACTIVITY LIMITATIONS: continence  PARTICIPATION LIMITATIONS: community activity  PERSONAL FACTORS: Time since onset of injury/illness/exacerbation are also affecting patient's functional outcome.   REHAB POTENTIAL: Good  CLINICAL DECISION MAKING: Stable/uncomplicated  EVALUATION COMPLEXITY: Low   GOALS: Goals reviewed with patient? Yes  SHORT TERM GOALS: Target date: 08/18/24  Pt to be I with HEP for carry over and continuing recommendations for improved outcomes.   Baseline: Goal status: INITIAL  2.  Pt will be independent with use of squatty potty, relaxed toileting mechanics, and improved bowel movement techniques in order to increase ease of bowel movements and complete evacuation.   Baseline:  Goal status: INITIAL  3.  Pt will be independent with the knack, urge suppression technique, and double voiding in order to improve bladder  habits and decrease urinary incontinence.   Baseline:  Goal status: INITIAL  4. Pt will be able to correctly perform diaphragmatic breathing and appropriate pressure management in order to prevent worsening vaginal wall laxity and improve pelvic floor A/ROM.   Baseline:  Goal status: INITIAL  LONG TERM GOALS: Target date: 10/21/24  Pt to be I with advanced HEP for carry over and continuing recommendations for improved outcomes.   Baseline:  Goal status: INITIAL  2. Pt to demonstrate improved coordination of pelvic floor and breathing mechanics with 10# squat with appropriate synergistic patterns to decrease pain and leakage at least 75% of the time for improved ability to complete a 30 minute workout without strain at pelvic floor and symptoms.  Baseline:  Goal status: INITIAL  3.  Pt to report no more than 1 urinary incontinence in a week for improved confidence with leaving home.  Baseline:  Goal status: INITIAL  4.  Pt to report no more than 1 fecal incontinence in a week for improved confidence with leaving home.  Baseline:  Goal status: INITIAL  5.  Pt will report her bowel movements  are complete at least 75% of the time  due to improved bowel habits and evacuation techniques for decreased strain at pelvic floor and prolapse .  Baseline:  Goal status: INITIAL   PLAN:  PT FREQUENCY: 1x/week  PT DURATION: 10 sessions  PLANNED INTERVENTIONS: 97110-Therapeutic exercises, 97530- Therapeutic activity, 97112- Neuromuscular re-education, 97535- Self Care, 02859- Manual therapy, [phone]- Canalith repositioning, [id number]- Aquatic Therapy, [phone]- Electrical stimulation (manual), [phone] (1-2 muscles), [id number] (3+ muscles)- Dry Needling, Patient/Family education, Taping, Joint mobilization, Spinal mobilization, Scar mobilization, DME instructions, Cryotherapy, Moist heat, and Biofeedback  PLAN FOR NEXT SESSION: pressure management, prolapse relief positions, breathing and pelvic floor  coordination, core and hip strengthening with coordination of pelvic floor     Darryle Navy, PT, DPT 11/18/258:48 AM  [facility] [address] [place], [place] [address] Phone # [phone] Fax [phone]

## 2024-07-21 NOTE — Patient Instructions (Signed)
 5 mins while lying down twice daily (am/pm), gentle circles Right to left, shouldn't painful.    Types of Fiber  There are two main types of fiber:  insoluble and soluble.  Both of these types can prevent and relieve constipation and diarrhea, although some people find one or the other to be more easily digested.  This handout details information about both types of fiber. recommended 25-35 grams of fiber per day,  average 9-12 grams per meal   key is a balance between soluble and insoluble  Insoluble Fiber        Functions of Insoluble Fiber moves bulk through the intestines  controls and balances the pH (acidity) in the intestines   This type of fiber should be avoided or reduced if you have soft, frequent bowel movements or leakage      Benefits of Insoluble Fiber promotes regular bowel movement and prevents constipation  removes fecal waste through colon in less time  keeps an optimal pH in intestines to prevent microbes from producing cancer substances, therefore preventing colon cancer        Food Sources of Insoluble Fiber whole-wheat products  wheat bran "miller's bran" corn bran  flax seed or other seeds vegetables such as green beans, broccoli, cauliflower and potato skins  fruit skins and root vegetable skins  popcorn brown rice  Soluble Fiber( Types 5,6,7 - loose stools)       Functions of Soluble Fiber  holds water in the colon to bulk and soften the stool prolongs stomach emptying time so that sugar is released and absorbed more slowly  prevent leakage associated with soft, frequent bowel movements.        Benefits of Soluble Fiber lowers total cholesterol and LDL cholesterol (the bad cholesterol) therefore reducing the risk of heart disease  regulates blood sugar for people with diabetes       Food Sources of Soluble Fiber oat/oat bran dried beans and peas  nuts  barley  flax seed or other seeds fruits such as oranges, pears, peaches, and apples   vegetables such as carrots  psyllium husk  prunes

## 2024-08-03 DIAGNOSIS — E7800 Pure hypercholesterolemia, unspecified: Secondary | ICD-10-CM | POA: Diagnosis not present

## 2024-08-05 DIAGNOSIS — H401112 Primary open-angle glaucoma, right eye, moderate stage: Secondary | ICD-10-CM | POA: Diagnosis not present

## 2024-08-25 ENCOUNTER — Ambulatory Visit (HOSPITAL_BASED_OUTPATIENT_CLINIC_OR_DEPARTMENT_OTHER): Admitting: Cardiovascular Disease

## 2024-09-08 ENCOUNTER — Encounter (HOSPITAL_BASED_OUTPATIENT_CLINIC_OR_DEPARTMENT_OTHER): Payer: Self-pay | Admitting: Cardiology

## 2024-09-08 ENCOUNTER — Ambulatory Visit (HOSPITAL_BASED_OUTPATIENT_CLINIC_OR_DEPARTMENT_OTHER): Admitting: Cardiology

## 2024-09-08 VITALS — BP 118/68 | HR 73 | Ht 64.0 in | Wt 144.5 lb

## 2024-09-08 DIAGNOSIS — I10 Essential (primary) hypertension: Secondary | ICD-10-CM | POA: Diagnosis not present

## 2024-09-08 DIAGNOSIS — E7800 Pure hypercholesterolemia, unspecified: Secondary | ICD-10-CM

## 2024-09-08 DIAGNOSIS — Z7189 Other specified counseling: Secondary | ICD-10-CM

## 2024-09-08 DIAGNOSIS — E871 Hypo-osmolality and hyponatremia: Secondary | ICD-10-CM

## 2024-09-08 DIAGNOSIS — Z712 Person consulting for explanation of examination or test findings: Secondary | ICD-10-CM

## 2024-09-08 DIAGNOSIS — I2510 Atherosclerotic heart disease of native coronary artery without angina pectoris: Secondary | ICD-10-CM | POA: Diagnosis not present

## 2024-09-08 NOTE — Progress Notes (Signed)
 " Cardiology Office Note:  .   Date:  09/08/2024  ID:  Tina Wagner, DOB Jan 22, 1946, MRN [id number] PCP: Katina Pfeiffer, PA-C  [facility] Providers Cardiologist:  Shelda Bruckner, MD {  History of Present Illness: .   Tina Wagner is a 79 y.o. 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 She is seen as a new patient at the request of Pfeiffer Katina for coronary artery calcification. She saw Dr. Raford in 2022 but has not been seen by cardiology since.  Referral from 05/16/24 reviewed. Had CT chest 03/04/24, noted aortic atherosclerosis and LAD coronary calcification. Referred to cardiology for further evaluation. She was started on rosuvastatin and referred to cardiology for further evaluation.  CV history: seen in 2022 for palpitations, atypical chest pain. Stress myoview  without ischemia in 2022. Had evaluation in 2017 for similar. Monitor, ETT in 2017 unremarkable.  Today: Here today to discuss CT that showed aortic and LAD calcifications. Discussed this at length together. Discussed calcium, plaque, statins, aspirin, lpa. Discussed signs/symptoms to watch for. She is able to be active, watches her heart rate when she feels short of breath with exertion and will back off if her heart rate is much above 120 bpm. Can climb stairs, did have remote episode where she felt severely short of breath with stairs but this is not typical.  Likes to walk for exercise. [facility], goes to [employer] workouts.   ROS: Denies significant chest pain, shortness of breath at rest or with normal exertion. No PND, orthopnea, LE edema or unexpected weight gain. No syncope or palpitations. ROS otherwise negative except as noted.   Studies Reviewed: SABRA    EKG:       Physical Exam:   VS:  BP 118/68 (Cuff Size: Normal)   Pulse 73   Ht 5' 4 (1.626 m)   Wt 144 lb 8 oz (65.5 kg)   SpO2 96%   BMI 24.80 kg/m    Wt Readings from Last 3  Encounters:  09/08/24 144 lb 8 oz (65.5 kg)  01/22/24 143 lb 4.8 oz (65 kg)  11/25/20 148 lb (67.1 kg)    GEN: Well nourished, well developed in no acute distress HEENT: Normal, moist mucous membranes NECK: No JVD CARDIAC: regular rhythm, normal S1 and S2, no rubs or gallops. No murmur. VASCULAR: Radial and DP pulses 2+ bilaterally. No carotid bruits RESPIRATORY:  Clear to auscultation without rales, wheezing or rhonchi  ABDOMEN: Soft, non-tender, non-distended MUSCULOSKELETAL:  Ambulates independently SKIN: Warm and dry, no edema NEUROLOGIC:  Alert and oriented x 3. No focal neuro deficits noted. PSYCHIATRIC:  Normal affect    ASSESSMENT AND PLAN: .    Coronary artery calcifications, consistent with nonobstructive CAD Hypercholesterolemia -discussed test results today -We discussed the pathophysiology of cholesterol plaque formation, the role of calcium and why it is a marker, how plaque is key to acute MI/CVA, and how known plaque is managed with medications.   -we discussed the data on statins, both in terms of their long term benefit as well as the risk of side effects. Reviewed common misconceptions about statins. She has been started on rosuvastatin, most recent per [facility] show lipids from 07/17/24 with Tchol 144, HDL 88, LDL 42, TG 68 -discussed guidelines recommending aspirin. Discussed watching for signs of bleeding -discussed lpa, she will think about it -had prior nuclear stress and treadmill that were unremarkable -reviewed red flag warning signs that need immediate  medical attention   Hypertension Hyponatremia history, SIADH -on amlodipine  -avoid diuretics  CV risk counseling and prevention -recommend heart healthy/Mediterranean diet, with whole grains, fruits, vegetable, fish, lean meats, nuts, and olive oil. Limit salt. -recommend moderate walking, 3-5 times/week for 30-50 minutes each session. Aim for at least 150 minutes/week. Goal should be pace of 3 miles/hours, or  walking 1.5 miles in 30 minutes -recommend avoidance of tobacco products. Avoid excess alcohol.  Dispo: 2 year or sooner as needed  Signed, Shelda Bruckner, MD   Shelda Bruckner, MD, PhD, [facility] [place]  [facility]  [place]  [facility] [address], [address] [place], [place] [address] [phone]   "

## 2024-09-08 NOTE — Patient Instructions (Signed)
 Medication Instructions:  No changes *If you need a refill on your cardiac medications before your next appointment, please call your pharmacy*  Lab Work: None  Testing/Procedures: none  Follow-Up: At [facility], you and your health needs are our priority.  As part of our continuing mission to provide you with exceptional heart care, our providers are all part of one team.  This team includes your primary Cardiologist (physician) and Advanced Practice Providers or APPs (Physician Assistants and Nurse Practitioners) who all work together to provide you with the care you need, when you need it.  Your next appointment:   2 year(s)  Provider:   Shelda Bruckner, MD, Rosaline Bane, NP, or Reche Finder, NP

## 2024-09-09 ENCOUNTER — Ambulatory Visit: Admitting: Physical Therapy

## 2024-09-09 DIAGNOSIS — R293 Abnormal posture: Secondary | ICD-10-CM | POA: Insufficient documentation

## 2024-09-09 DIAGNOSIS — M6281 Muscle weakness (generalized): Secondary | ICD-10-CM | POA: Diagnosis present

## 2024-09-09 DIAGNOSIS — R279 Unspecified lack of coordination: Secondary | ICD-10-CM | POA: Diagnosis present

## 2024-09-09 NOTE — Therapy (Unsigned)
 " OUTPATIENT PHYSICAL THERAPY FEMALE PELVIC TREATMENT   Patient Name: Tina Wagner MRN: [id number] DOB:1946-01-11, 79 y.o., female Today's Date: 09/10/2024  END OF SESSION:  PT End of Session - 09/09/24 1449     Visit Number 2    Number of Visits 12    Date for Recertification  10/21/24    Authorization Type medicare    Progress Note Due on Visit 10    PT Start Time 1447    PT Stop Time 1526    PT Time Calculation (min) 39 min    Activity Tolerance Patient tolerated treatment well    Behavior During Therapy [facility] for tasks assessed/performed          Past Medical History:  Diagnosis Date   Atypical chest pain 12/09/2015   Palpitations 12/09/2015   No past surgical history on file. Patient Active Problem List   Diagnosis Date Noted   Elevated blood pressure 01/22/2024   Acute hyponatremia 01/22/2024   Hyponatremia 01/21/2024   Atypical chest pain 12/09/2015   Palpitations 12/09/2015    PCP: Katina Pfeiffer, PA-C   REFERRING PROVIDER: Flipping, Leonette RIGGERS   REFERRING DIAG: K62.3 (ICD-10-CM) - Rectal prolapse  THERAPY DIAG:  Muscle weakness (generalized)  Abnormal posture  Unspecified lack of coordination  Rationale for Evaluation and Treatment: Rehabilitation  ONSET DATE: at least 3 years ago.   SUBJECTIVE:                                                                                                                                                                                           SUBJECTIVE STATEMENT: Has been using squatty potty and has been doing HEP. Hasn't seen a lot of improvement yet, but reports if able to have bowel movement its type 1 unless using Linzess then is urgent and sudden.    I feel like something is leaving my body and I want to hold it in. Sometimes have fecal incontinence with walking and this makes her nervous and embarrassed and she limits activity because of this. Also has fecal leakage with activity in general no concerns  with sitting. Any abdominal exercises as well. Usually small fecal amounts of leakage, and very soft.   Also feels like prolapse comes out with stool for bowel movements. Also struggles with emptying bladder until emptying bowels sometimes.   Fluid intake: water - decreased water due to sodium levels but has been rechecked and sodium is increasing but now able to drink ~64 oz of water daily.   FUNCTIONAL LIMITATIONS: all exercises, walking, bowel movements, emptying bladder  PERTINENT HISTORY:  Medications for current condition: no Surgeries: no  Other: no Sexual abuse: No  PAIN:  Are you having pain? No   PRECAUTIONS: None  RED FLAGS: None   WEIGHT BEARING RESTRICTIONS: No  FALLS:  Has patient fallen in last 6 months? No  OCCUPATION: retired   ACTIVITY LEVEL : low- mod  PLOF: Independent  PATIENT GOALS: to have more regular bowel movements and no leakage     BOWEL MOVEMENT: Pain with bowel movement: No Type of bowel movement:Type (Bristol Stool Scale) 4-5, Frequency every other day, Strain sometimes, and Splinting to return prolapse in Fully empty rectum: No Leakage: Yes: activity, walking, exercises                                                 Bowel urgency: occasionally with medication  Pads: Yes: liner AAT during day Fiber supplement/laxative linzess   URINATION: Pain with urination: No Fully empty bladder: Yes: but sometimes difficult to start with bowel movement                                          Post-void dribble: No Stream: Strong Urgency: No Frequency:around every 2 hours                                                        Nocturia: Nomaybe once occasionally    Leakage: if has a very full bladder and then coughs/sneezes/laughs wil have small amt Pads/briefs:   INTERCOURSE:  Ability to have vaginal penetration No    PREGNANCY: Vaginal deliveries 3 Tearing No Episiotomy Yes - first  C-section deliveries .0 Currently pregnant  No  PROLAPSE: Rectally    OBJECTIVE:  Note: Objective measures were completed at Evaluation unless otherwise noted.  DIAGNOSTIC FINDINGS:    COGNITION: Overall cognitive status: Within functional limits for tasks assessed     SENSATION: Light touch: Appears intact  LUMBAR SPECIAL TESTS:  SI Compression/distraction test: Negative  FUNCTIONAL TESTS:   Single leg stance:instability bil mild hip drop    Sit-up test: reports feeling like she may need to have bowel movement, but no leakage  Squat: bil knee valgus and decreased descent by 50%  GAIT: WFL   POSTURE: rounded shoulders   LUMBARAROM/PROM:  A/PROM A/PROM  Eval (% available)  Flexion 100  Extension 100  Right lateral flexion 75  Left lateral flexion 75  Right rotation 75  Left rotation 75   (Blank rows = not tested)  LOWER EXTREMITY ROM:  Bil hamstrings and adductors limited by 25%  LOWER EXTREMITY MMT:  Bil hip strength grossly 4/5 PALPATION:  General: tightness in lumbar paraspinals bil   Pelvic Alignment: WFL  Abdominal: tightness in lower abdominal quadrants                 External Perineal Exam: pt deferred                              Internal Pelvic Floor: pt deferred   Patient confirms identification and approves PT to assess internal pelvic floor and treatment No All  internal or external pelvic floor assessments and/or treatments are completed with proper hand hygiene and gloves hands. If needed gloves are changed with hand hygiene during patient care time.  PELVIC MMT:   MMT eval  Vaginal   Internal Anal Sphincter   External Anal Sphincter   Puborectalis   (Blank rows = not tested)        TONE: Deferred   PROLAPSE: Deferred   TODAY'S TREATMENT:                                                                                                                              DATE:   07/21/24 EVAL Examination completed, findings reviewed, pt educated on POC, HEP, and educated  on abdominal massage, voiding mechanics, and fiber types. Pt motivated to participate in PT and agreeable to attempt recommendations.     09/09/24: Pt educated on fiber types, relaxation techniques for voiding, voiding mechanics, reviewed abdominal massage, updated  HEP and reviewed with pt, and pt completed x2-3 reps of all exercises. PT also educated on pressure management for decreased strain at pelvic floor and prolapse.  PATIENT EDUCATION:  Education details: [email or web address] Person educated: Patient Education method: Explanation, Demonstration, Tactile cues, Verbal cues, and Handouts Education comprehension: verbalized understanding, returned demonstration, verbal cues required, tactile cues required, and needs further education  HOME EXERCISE PROGRAM: [email or web address], abdominal massage, voiding mechanics, and fiber types Access Code: [email or web address] URL: [email or web address] Date: 09/10/2024 Prepared by: Darryle  Exercises - Supine Hip Internal and External Rotation  - 1 x daily - 7 x weekly - 1 sets - 10 reps - Cat Cow  - 1 x daily - 7 x weekly - 1 sets - 10 reps - Standing L  - 1 x daily - 7 x weekly - 1 sets - 3 reps - 30s holds - standing thread the needle  - 1 x daily - 7 x weekly - 1 sets - 10 reps  Patient Education - Bowel Emptying Techniques - Abdominal Massage for Constipation  ASSESSMENT:  CLINICAL IMPRESSION: Patient is a 79 y.o. female  who was seen today for physical therapy treatment for rectal prolapse. Pt reports she does feel some improvement but symptoms still persist, denied questions at end of session and tolerated well. Pt would benefit from additional PT to further address deficits.    OBJECTIVE IMPAIRMENTS: decreased activity tolerance, decreased coordination, decreased endurance, decreased mobility, decreased strength, increased fascial restrictions, impaired flexibility, improper body mechanics, and postural dysfunction.   ACTIVITY LIMITATIONS:  continence  PARTICIPATION LIMITATIONS: community activity  PERSONAL FACTORS: Time since onset of injury/illness/exacerbation are also affecting patient's functional outcome.   REHAB POTENTIAL: Good  CLINICAL DECISION MAKING: Stable/uncomplicated  EVALUATION COMPLEXITY: Low   GOALS: Goals reviewed with patient? Yes  SHORT TERM GOALS: Target date: 08/18/24  Pt to be I with HEP for carry over and continuing recommendations for improved outcomes.   Baseline: Goal status: INITIAL  2.  Pt will be independent  with use of squatty potty, relaxed toileting mechanics, and improved bowel movement techniques in order to increase ease of bowel movements and complete evacuation.   Baseline:  Goal status: INITIAL  3.  Pt will be independent with the knack, urge suppression technique, and double voiding in order to improve bladder habits and decrease urinary incontinence.   Baseline:  Goal status: INITIAL  4. Pt will be able to correctly perform diaphragmatic breathing and appropriate pressure management in order to prevent worsening vaginal wall laxity and improve pelvic floor A/ROM.   Baseline:  Goal status: INITIAL  LONG TERM GOALS: Target date: 10/21/24  Pt to be I with advanced HEP for carry over and continuing recommendations for improved outcomes.   Baseline:  Goal status: INITIAL  2. Pt to demonstrate improved coordination of pelvic floor and breathing mechanics with 10# squat with appropriate synergistic patterns to decrease pain and leakage at least 75% of the time for improved ability to complete a 30 minute workout without strain at pelvic floor and symptoms.    Baseline:  Goal status: INITIAL  3.  Pt to report no more than 1 urinary incontinence in a week for improved confidence with leaving home.  Baseline:  Goal status: INITIAL  4.  Pt to report no more than 1 fecal incontinence in a week for improved confidence with leaving home.  Baseline:  Goal status: INITIAL  5.   Pt will report her bowel movements  are complete at least 75% of the time  due to improved bowel habits and evacuation techniques for decreased strain at pelvic floor and prolapse .  Baseline:  Goal status: INITIAL   PLAN:  PT FREQUENCY: 1x/week  PT DURATION: 10 sessions  PLANNED INTERVENTIONS: 97110-Therapeutic exercises, 97530- Therapeutic activity, 97112- Neuromuscular re-education, 97535- Self Care, [id number]- Manual therapy, [phone]- Canalith repositioning, [id number]- Aquatic Therapy, [phone]- Electrical stimulation (manual), [phone] (1-2 muscles), [phone] (3+ muscles)- Dry Needling, Patient/Family education, Taping, Joint mobilization, Spinal mobilization, Scar mobilization, DME instructions, Cryotherapy, Moist heat, and Biofeedback  PLAN FOR NEXT SESSION: pressure management, prolapse relief positions, breathing and pelvic floor coordination, core and hip strengthening with coordination of pelvic floor     Darryle Navy, PT, DPT 1/7/20267:58 AM  [facility] [address], [address] [place], [place] [address] Phone # [phone] Fax [phone]  "

## 2024-09-09 NOTE — Patient Instructions (Signed)
 Types of Fiber  There are two main types of fiber:  insoluble and soluble.  Both of these types can prevent and relieve constipation and diarrhea, although some people find one or the other to be more easily digested.  This handout details information about both types of fiber. recommended 25-35 grams of fiber per day,  average 9-12 grams per meal   key is a balance between soluble and insoluble  Insoluble Fiber        Functions of Insoluble Fiber moves bulk through the intestines  controls and balances the pH (acidity) in the intestines   This type of fiber should be avoided or reduced if you have soft, frequent bowel movements or leakage      Benefits of Insoluble Fiber promotes regular bowel movement and prevents constipation  removes fecal waste through colon in less time  keeps an optimal pH in intestines to prevent microbes from producing cancer substances, therefore preventing colon cancer        Food Sources of Insoluble Fiber whole-wheat products  wheat bran "miller's bran" corn bran  flax seed or other seeds vegetables such as green beans, broccoli, cauliflower and potato skins  fruit skins and root vegetable skins  popcorn brown rice  Soluble Fiber( Types 5,6,7 - loose stools)       Functions of Soluble Fiber  holds water in the colon to bulk and soften the stool prolongs stomach emptying time so that sugar is released and absorbed more slowly  prevent leakage associated with soft, frequent bowel movements.        Benefits of Soluble Fiber lowers total cholesterol and LDL cholesterol (the bad cholesterol) therefore reducing the risk of heart disease  regulates blood sugar for people with diabetes       Food Sources of Soluble Fiber oat/oat bran dried beans and peas  nuts  barley  flax seed or other seeds fruits such as oranges, pears, peaches, and apples  vegetables such as carrots  psyllium husk  prunes

## 2024-09-15 ENCOUNTER — Ambulatory Visit: Admitting: Physical Therapy

## 2024-09-15 DIAGNOSIS — M6281 Muscle weakness (generalized): Secondary | ICD-10-CM | POA: Diagnosis not present

## 2024-09-15 DIAGNOSIS — R279 Unspecified lack of coordination: Secondary | ICD-10-CM

## 2024-09-15 DIAGNOSIS — R293 Abnormal posture: Secondary | ICD-10-CM

## 2024-09-15 NOTE — Therapy (Signed)
 " OUTPATIENT PHYSICAL THERAPY FEMALE PELVIC TREATMENT   Patient Name: Tina Wagner MRN: [id number] DOB:[birth year], 79 y.o., female Today's Date: 09/15/2024  END OF SESSION:  PT End of Session - 09/15/24 1017     Visit Number 3    Number of Visits 12    Date for Recertification  10/21/24    Authorization Type medicare    Progress Note Due on Visit 10    PT Start Time 1015    PT Stop Time 1053    PT Time Calculation (min) 38 min    Activity Tolerance Patient tolerated treatment well    Behavior During Therapy [facility] for tasks assessed/performed          Past Medical History:  Diagnosis Date   Atypical chest pain 12/09/2015   Palpitations 12/09/2015   No past surgical history on file. Patient Active Problem List   Diagnosis Date Noted   Elevated blood pressure 01/22/2024   Acute hyponatremia 01/22/2024   Hyponatremia 01/21/2024   Atypical chest pain 12/09/2015   Palpitations 12/09/2015    PCP: Katina Pfeiffer, PA-C   REFERRING PROVIDER: Flipping, Leonette RIGGERS   REFERRING DIAG: K62.3 (ICD-10-CM) - Rectal prolapse  THERAPY DIAG:  Muscle weakness (generalized)  Abnormal posture  Unspecified lack of coordination  Rationale for Evaluation and Treatment: Rehabilitation  ONSET DATE: at least 3 years ago.   SUBJECTIVE:                                                                                                                                                                                           SUBJECTIVE STATEMENT: Has been seeing improvement - moving stool better end of last week. Trying to increase fiber. A little inconsistent but has had good days with over 20 grams of fiber daily.  Still having type 1 but was daily last week.   Very aware of low back tightness and improving with HEP   I feel like something is leaving my body and I want to hold it in. Sometimes have fecal incontinence with walking and this makes her nervous and embarrassed and she limits  activity because of this. Also has fecal leakage with activity in general no concerns with sitting. Any abdominal exercises as well. Usually small fecal amounts of leakage, and very soft.   Also feels like prolapse comes out with stool for bowel movements. Also struggles with emptying bladder until emptying bowels sometimes.   Fluid intake: water - decreased water due to sodium levels but has been rechecked and sodium is increasing but now able to drink ~64 oz of water daily.   FUNCTIONAL LIMITATIONS: all exercises,  walking, bowel movements, emptying bladder  PERTINENT HISTORY:  Medications for current condition: no Surgeries: no Other: no Sexual abuse: No  PAIN:  Are you having pain? No   PRECAUTIONS: None  RED FLAGS: None   WEIGHT BEARING RESTRICTIONS: No  FALLS:  Has patient fallen in last 6 months? No  OCCUPATION: retired   ACTIVITY LEVEL : low- mod  PLOF: Independent  PATIENT GOALS: to have more regular bowel movements and no leakage     BOWEL MOVEMENT: Pain with bowel movement: No Type of bowel movement:Type (Bristol Stool Scale) 4-5, Frequency every other day, Strain sometimes, and Splinting to return prolapse in Fully empty rectum: No Leakage: Yes: activity, walking, exercises                                                 Bowel urgency: occasionally with medication  Pads: Yes: liner AAT during day Fiber supplement/laxative linzess   URINATION: Pain with urination: No Fully empty bladder: Yes: but sometimes difficult to start with bowel movement                                          Post-void dribble: No Stream: Strong Urgency: No Frequency:around every 2 hours                                                        Nocturia: Nomaybe once occasionally    Leakage: if has a very full bladder and then coughs/sneezes/laughs wil have small amt Pads/briefs:   INTERCOURSE:  Ability to have vaginal penetration No    PREGNANCY: Vaginal deliveries  3 Tearing No Episiotomy Yes - first  C-section deliveries .0 Currently pregnant No  PROLAPSE: Rectally    OBJECTIVE:  Note: Objective measures were completed at Evaluation unless otherwise noted.  DIAGNOSTIC FINDINGS:    COGNITION: Overall cognitive status: Within functional limits for tasks assessed     SENSATION: Light touch: Appears intact  LUMBAR SPECIAL TESTS:  SI Compression/distraction test: Negative  FUNCTIONAL TESTS:   Single leg stance:instability bil mild hip drop    Sit-up test: reports feeling like she may need to have bowel movement, but no leakage  Squat: bil knee valgus and decreased descent by 50%  GAIT: WFL   POSTURE: rounded shoulders   LUMBARAROM/PROM:  A/PROM A/PROM  Eval (% available)  Flexion 100  Extension 100  Right lateral flexion 75  Left lateral flexion 75  Right rotation 75  Left rotation 75   (Blank rows = not tested)  LOWER EXTREMITY ROM:  Bil hamstrings and adductors limited by 25%  LOWER EXTREMITY MMT:  Bil hip strength grossly 4/5 PALPATION:  General: tightness in lumbar paraspinals bil   Pelvic Alignment: WFL  Abdominal: tightness in lower abdominal quadrants                 External Perineal Exam: pt deferred                              Internal Pelvic Floor: pt deferred  Patient confirms identification and approves PT to assess internal pelvic floor and treatment No All internal or external pelvic floor assessments and/or treatments are completed with proper hand hygiene and gloves hands. If needed gloves are changed with hand hygiene during patient care time.  PELVIC MMT:   MMT 09/15/24  Vaginal   Internal Anal Sphincter 2/5  External Anal Sphincter 3/5, 8s, 3 reps  Puborectalis 3/5  (Blank rows = not tested)        TONE: Decreased    PROLAPSE: Not seen in hooklying rectally    TODAY'S TREATMENT:                                                                                                                               DATE:   07/21/24 EVAL Examination completed, findings reviewed, pt educated on POC, HEP, and educated on abdominal massage, voiding mechanics, and fiber types. Pt motivated to participate in PT and agreeable to attempt recommendations.     09/09/24: Pt educated on fiber types, relaxation techniques for voiding, voiding mechanics, reviewed abdominal massage, updated  HEP and reviewed with pt, and pt completed x2-3 reps of all exercises. PT also educated on pressure management for decreased strain at pelvic floor and prolapse.  09/15/24: Patient consented to internal pelvic floor assessment and treatment rectally this date and found to have decreased strength, endurance, and coordination with limited activation at puborectalis. X10 reps in all quadrants of quick release techniques for improved activation throughout superficial pelvic floor, with good response. Pt denied pain and demonstrated good techniques with activation, relaxation, bulge. Did not see rectal tissue laxity with strain downward. Findings above in chart.  X10 pelvic floor contractions X10 quick flicks X10 isometrics 8s best held but decreased after 3 reps.  Pt educated on continuing abdominal massage as she reports she has not been as consistent with this.    PATIENT EDUCATION:  Education details: [email or web address] Person educated: Patient Education method: Explanation, Demonstration, Tactile cues, Verbal cues, and Handouts Education comprehension: verbalized understanding, returned demonstration, verbal cues required, tactile cues required, and needs further education  HOME EXERCISE PROGRAM: [email or web address], abdominal massage, voiding mechanics, and fiber types Access Code: [email or web address] URL: [email or web address] Date: 09/10/2024 Prepared by: Darryle  Exercises - Supine Hip Internal and External Rotation  - 1 x daily - 7 x weekly - 1 sets - 10 reps - Cat Cow  - 1 x daily - 7 x weekly - 1 sets - 10  reps - Standing L  - 1 x daily - 7 x weekly - 1 sets - 3 reps - 30s holds - standing thread the needle  - 1 x daily - 7 x weekly - 1 sets - 10 reps  Patient Education - Bowel Emptying Techniques - Abdominal Massage for Constipation  ASSESSMENT:  CLINICAL IMPRESSION: Patient is a 79 y.o. female  who was seen today for physical therapy treatment for rectal prolapse. Is seeing more bowel movements but  still type 1. Encouraged to ask provider about fiber/laxative dosages as PT unable to recommend this. But pt tolerated session well, denied pain. Does demonstrated pelvic floor weakness at posterior pelvic floor and benefited from NMRE techniques for quick release with improved enragement. Pt would benefit from additional PT to further address deficits.    OBJECTIVE IMPAIRMENTS: decreased activity tolerance, decreased coordination, decreased endurance, decreased mobility, decreased strength, increased fascial restrictions, impaired flexibility, improper body mechanics, and postural dysfunction.   ACTIVITY LIMITATIONS: continence  PARTICIPATION LIMITATIONS: community activity  PERSONAL FACTORS: Time since onset of injury/illness/exacerbation are also affecting patient's functional outcome.   REHAB POTENTIAL: Good  CLINICAL DECISION MAKING: Stable/uncomplicated  EVALUATION COMPLEXITY: Low   GOALS: Goals reviewed with patient? Yes  SHORT TERM GOALS: Target date: 08/18/24  Pt to be I with HEP for carry over and continuing recommendations for improved outcomes.   Baseline: Goal status: MET 09/15/24  2.  Pt will be independent with use of squatty potty, relaxed toileting mechanics, and improved bowel movement techniques in order to increase ease of bowel movements and complete evacuation.   Baseline:  Goal status: MET 09/15/24  3.  Pt will be independent with the knack, urge suppression technique, and double voiding in order to improve bladder habits and decrease urinary incontinence.    Baseline:  Goal status: not focused in session yet   4. Pt will be able to correctly perform diaphragmatic breathing and appropriate pressure management in order to prevent worsening vaginal wall laxity and improve pelvic floor A/ROM.   Baseline:  Goal status: MET 09/15/24  LONG TERM GOALS: Target date: 10/21/24  Pt to be I with advanced HEP for carry over and continuing recommendations for improved outcomes.   Baseline:  Goal status: INITIAL  2. Pt to demonstrate improved coordination of pelvic floor and breathing mechanics with 10# squat with appropriate synergistic patterns to decrease pain and leakage at least 75% of the time for improved ability to complete a 30 minute workout without strain at pelvic floor and symptoms.    Baseline:  Goal status: INITIAL  3.  Pt to report no more than 1 urinary incontinence in a week for improved confidence with leaving home.  Baseline:  Goal status: INITIAL  4.  Pt to report no more than 1 fecal incontinence in a week for improved confidence with leaving home.  Baseline:  Goal status: INITIAL  5.  Pt will report her bowel movements  are complete at least 75% of the time  due to improved bowel habits and evacuation techniques for decreased strain at pelvic floor and prolapse .  Baseline:  Goal status: INITIAL   PLAN:  PT FREQUENCY: 1x/week  PT DURATION: 10 sessions  PLANNED INTERVENTIONS: 97110-Therapeutic exercises, 97530- Therapeutic activity, 97112- Neuromuscular re-education, 97535- Self Care, 02859- Manual therapy, [phone]- Canalith repositioning, [id number]- Aquatic Therapy, [phone]- Electrical stimulation (manual), [phone] (1-2 muscles), [id number] (3+ muscles)- Dry Needling, Patient/Family education, Taping, Joint mobilization, Spinal mobilization, Scar mobilization, DME instructions, Cryotherapy, Moist heat, and Biofeedback  PLAN FOR NEXT SESSION: pressure management, prolapse relief positions, breathing and pelvic floor coordination, core and  hip strengthening with coordination of pelvic floor     Darryle Navy, PT, DPT 1/12/202611:09 AM  [facility] [address] [place], [place] [address] Phone # [phone] Fax [phone]  "

## 2024-09-22 ENCOUNTER — Ambulatory Visit: Admitting: Physical Therapy

## 2024-09-22 DIAGNOSIS — M6281 Muscle weakness (generalized): Secondary | ICD-10-CM | POA: Diagnosis not present

## 2024-09-22 DIAGNOSIS — R293 Abnormal posture: Secondary | ICD-10-CM

## 2024-09-22 DIAGNOSIS — R279 Unspecified lack of coordination: Secondary | ICD-10-CM

## 2024-09-22 NOTE — Therapy (Signed)
 " OUTPATIENT PHYSICAL THERAPY FEMALE PELVIC TREATMENT   Patient Name: Tina Wagner MRN: [id number] DOB:Sep 24, 1945, 79 y.o., female Today's Date: 09/22/2024  END OF SESSION:  PT End of Session - 09/22/24 1024     Visit Number 4    Number of Visits 12    Date for Recertification  10/21/24    Authorization Type medicare    Progress Note Due on Visit 10    PT Start Time 1020    PT Stop Time 1059    PT Time Calculation (min) 39 min    Activity Tolerance Patient tolerated treatment well    Behavior During Therapy [facility] for tasks assessed/performed           Past Medical History:  Diagnosis Date   Atypical chest pain 12/09/2015   Palpitations 12/09/2015   No past surgical history on file. Patient Active Problem List   Diagnosis Date Noted   Elevated blood pressure 01/22/2024   Acute hyponatremia 01/22/2024   Hyponatremia 01/21/2024   Atypical chest pain 12/09/2015   Palpitations 12/09/2015    PCP: Katina Pfeiffer, PA-C   REFERRING PROVIDER: Flipping, Leonette RIGGERS   REFERRING DIAG: K62.3 (ICD-10-CM) - Rectal prolapse  THERAPY DIAG:  Muscle weakness (generalized)  Abnormal posture  Unspecified lack of coordination  Rationale for Evaluation and Treatment: Rehabilitation  ONSET DATE: at least 3 years ago.   SUBJECTIVE:                                                                                                                                                                                           SUBJECTIVE STATEMENT: Has been eating more fiber, had a good day with fully emptying and easily passed stool but then fluctuates.   Very aware of low back tightness and improving with HEP   I feel like something is leaving my body and I want to hold it in. Sometimes have fecal incontinence with walking and this makes her nervous and embarrassed and she limits activity because of this. Also has fecal leakage with activity in general no concerns with sitting. Any  abdominal exercises as well. Usually small fecal amounts of leakage, and very soft.   Also feels like prolapse comes out with stool for bowel movements. Also struggles with emptying bladder until emptying bowels sometimes.   Fluid intake: water - decreased water due to sodium levels but has been rechecked and sodium is increasing but now able to drink ~64 oz of water daily.   FUNCTIONAL LIMITATIONS: all exercises, walking, bowel movements, emptying bladder  PERTINENT HISTORY:  Medications for current condition: no Surgeries: no Other: no Sexual abuse: No  PAIN:  Are you having pain? No   PRECAUTIONS: None  RED FLAGS: None   WEIGHT BEARING RESTRICTIONS: No  FALLS:  Has patient fallen in last 6 months? No  OCCUPATION: retired   ACTIVITY LEVEL : low- mod  PLOF: Independent  PATIENT GOALS: to have more regular bowel movements and no leakage     BOWEL MOVEMENT: Pain with bowel movement: No Type of bowel movement:Type (Bristol Stool Scale) 4-5, Frequency every other day, Strain sometimes, and Splinting to return prolapse in Fully empty rectum: No Leakage: Yes: activity, walking, exercises                                                 Bowel urgency: occasionally with medication  Pads: Yes: liner AAT during day Fiber supplement/laxative linzess   URINATION: Pain with urination: No Fully empty bladder: Yes: but sometimes difficult to start with bowel movement                                          Post-void dribble: No Stream: Strong Urgency: No Frequency:around every 2 hours                                                        Nocturia: Nomaybe once occasionally    Leakage: if has a very full bladder and then coughs/sneezes/laughs wil have small amt Pads/briefs:   INTERCOURSE:  Ability to have vaginal penetration No    PREGNANCY: Vaginal deliveries 3 Tearing No Episiotomy Yes - first  C-section deliveries .0 Currently pregnant No  PROLAPSE: Rectally     OBJECTIVE:  Note: Objective measures were completed at Evaluation unless otherwise noted.  DIAGNOSTIC FINDINGS:    COGNITION: Overall cognitive status: Within functional limits for tasks assessed     SENSATION: Light touch: Appears intact  LUMBAR SPECIAL TESTS:  SI Compression/distraction test: Negative  FUNCTIONAL TESTS:   Single leg stance:instability bil mild hip drop    Sit-up test: reports feeling like she may need to have bowel movement, but no leakage  Squat: bil knee valgus and decreased descent by 50%  GAIT: WFL   POSTURE: rounded shoulders   LUMBARAROM/PROM:  A/PROM A/PROM  Eval (% available)  Flexion 100  Extension 100  Right lateral flexion 75  Left lateral flexion 75  Right rotation 75  Left rotation 75   (Blank rows = not tested)  LOWER EXTREMITY ROM:  Bil hamstrings and adductors limited by 25%  LOWER EXTREMITY MMT:  Bil hip strength grossly 4/5 PALPATION:  General: tightness in lumbar paraspinals bil   Pelvic Alignment: WFL  Abdominal: tightness in lower abdominal quadrants                 External Perineal Exam: pt deferred                              Internal Pelvic Floor: pt deferred   Patient confirms identification and approves PT to assess internal pelvic floor and treatment No All internal or external pelvic floor assessments  and/or treatments are completed with proper hand hygiene and gloves hands. If needed gloves are changed with hand hygiene during patient care time.  PELVIC MMT:   MMT 09/15/24  Vaginal   Internal Anal Sphincter 2/5  External Anal Sphincter 3/5, 8s, 3 reps  Puborectalis 3/5  (Blank rows = not tested)        TONE: Decreased    PROLAPSE: Not seen in hooklying rectally    TODAY'S TREATMENT:                                                                                                                              DATE:   07/21/24 EVAL Examination completed, findings reviewed, pt educated on  POC, HEP, and educated on abdominal massage, voiding mechanics, and fiber types. Pt motivated to participate in PT and agreeable to attempt recommendations.     09/09/24: Pt educated on fiber types, relaxation techniques for voiding, voiding mechanics, reviewed abdominal massage, updated  HEP and reviewed with pt, and pt completed x2-3 reps of all exercises. PT also educated on pressure management for decreased strain at pelvic floor and prolapse.  09/15/24: Patient consented to internal pelvic floor assessment and treatment rectally this date and found to have decreased strength, endurance, and coordination with limited activation at puborectalis. X10 reps in all quadrants of quick release techniques for improved activation throughout superficial pelvic floor, with good response. Pt denied pain and demonstrated good techniques with activation, relaxation, bulge. Did not see rectal tissue laxity with strain downward. Findings above in chart.  X10 pelvic floor contractions X10 quick flicks X10 isometrics 8s best held but decreased after 3 reps.  Pt educated on continuing abdominal massage as she reports she has not been as consistent with this.    09/22/24: Had several questions about habits with bowels and water and fiber foods all answered with general information and pt encouraged to follow up with nutritionist and pt agreed.  Pt educated on pressure management with pelvic props in evening and exercises completed with wedge in place today X10 pelvic floor contractions with breathing X10 quick flicks X5 5s isometrics Alt hip flexion 2x10 with breathing and pelvic floor contraction Hip abduction red band with breathing and pelvic floor contraction 2x10   Sit to stands with breathing and pelvic floor activation x10   PATIENT EDUCATION:  Education details: [email or web address] Person educated: Patient Education method: Explanation, Demonstration, Tactile cues, Verbal cues, and Handouts Education  comprehension: verbalized understanding, returned demonstration, verbal cues required, tactile cues required, and needs further education  HOME EXERCISE PROGRAM: [email or web address], abdominal massage, voiding mechanics, and fiber types Access Code: [email or web address] URL: [email or web address] Date: 09/10/2024 Prepared by: Darryle  Exercises - Supine Hip Internal and External Rotation  - 1 x daily - 7 x weekly - 1 sets - 10 reps - Cat Cow  - 1 x daily - 7 x weekly - 1 sets - 10 reps - Standing L  - 1 x daily -  7 x weekly - 1 sets - 3 reps - 30s holds - standing thread the needle  - 1 x daily - 7 x weekly - 1 sets - 10 reps  Patient Education - Bowel Emptying Techniques - Abdominal Massage for Constipation  ASSESSMENT:  CLINICAL IMPRESSION: Patient is a 79 y.o. female  who was seen today for physical therapy treatment for rectal prolapse. Pt reports she has had more softer stools (not consistently) but improving and when has softer stool does not feel prolapse but otherwise only feels it with bowel movements. Pt is doing HEP, abdominal massage, trying to eat more fiber, and drink water regularly. Pt seeing improvement and reports at least 50% better since starting PT. And having a bowel movement around every other day but not always fully emptying to without straining (is doing it much less now).  Pt would benefit from additional PT to further address deficits.    OBJECTIVE IMPAIRMENTS: decreased activity tolerance, decreased coordination, decreased endurance, decreased mobility, decreased strength, increased fascial restrictions, impaired flexibility, improper body mechanics, and postural dysfunction.   ACTIVITY LIMITATIONS: continence  PARTICIPATION LIMITATIONS: community activity  PERSONAL FACTORS: Time since onset of injury/illness/exacerbation are also affecting patient's functional outcome.   REHAB POTENTIAL: Good  CLINICAL DECISION MAKING: Stable/uncomplicated  EVALUATION  COMPLEXITY: Low   GOALS: Goals reviewed with patient? Yes  SHORT TERM GOALS: Target date: 08/18/24  Pt to be I with HEP for carry over and continuing recommendations for improved outcomes.   Baseline: Goal status: MET 09/15/24  2.  Pt will be independent with use of squatty potty, relaxed toileting mechanics, and improved bowel movement techniques in order to increase ease of bowel movements and complete evacuation.   Baseline:  Goal status: MET 09/15/24  3.  Pt will be independent with the knack, urge suppression technique, and double voiding in order to improve bladder habits and decrease urinary incontinence.   Baseline:  Goal status: not focused in session yet   4. Pt will be able to correctly perform diaphragmatic breathing and appropriate pressure management in order to prevent worsening vaginal wall laxity and improve pelvic floor A/ROM.   Baseline:  Goal status: MET 09/15/24  LONG TERM GOALS: Target date: 10/21/24  Pt to be I with advanced HEP for carry over and continuing recommendations for improved outcomes.   Baseline:  Goal status: INITIAL  2. Pt to demonstrate improved coordination of pelvic floor and breathing mechanics with 10# squat with appropriate synergistic patterns to decrease pain and leakage at least 75% of the time for improved ability to complete a 30 minute workout without strain at pelvic floor and symptoms.    Baseline:  Goal status: INITIAL  3.  Pt to report no more than 1 urinary incontinence in a week for improved confidence with leaving home.  Baseline:  Goal status: INITIAL  4.  Pt to report no more than 1 fecal incontinence in a week for improved confidence with leaving home.  Baseline:  Goal status: INITIAL  5.  Pt will report her bowel movements  are complete at least 75% of the time  due to improved bowel habits and evacuation techniques for decreased strain at pelvic floor and prolapse .  Baseline:  Goal status: INITIAL   PLAN:  PT  FREQUENCY: 1x/week  PT DURATION: 10 sessions  PLANNED INTERVENTIONS: 97110-Therapeutic exercises, 97530- Therapeutic activity, [id number]- Neuromuscular re-education, 97535- Self Care, 02859- Manual therapy, [id number]- Canalith repositioning, [id number]- Aquatic Therapy, [id number]- Electrical stimulation (manual), [id number] (1-2 muscles), [id number] (  3+ muscles)- Dry Needling, Patient/Family education, Taping, Joint mobilization, Spinal mobilization, Scar mobilization, DME instructions, Cryotherapy, Moist heat, and Biofeedback  PLAN FOR NEXT SESSION: pressure management, prolapse relief positions, breathing and pelvic floor coordination, core and hip strengthening with coordination of pelvic floor     Darryle Navy, PT, DPT 09/22/2609:03 AM  [facility] [address], [address] [place], [place] [address] Phone # [phone] Fax [phone]  "

## 2024-09-22 NOTE — Patient Instructions (Addendum)
  15-20 mins in the evenings based on tolerance.  Please clear any positions with doctor as needed.  Please stop any position if negative symptoms occur (dizziness/lightheadedness/fatigue/increased pain, etc.)  

## 2024-09-25 ENCOUNTER — Ambulatory Visit (INDEPENDENT_AMBULATORY_CARE_PROVIDER_SITE_OTHER): Admitting: Physician Assistant

## 2024-09-25 ENCOUNTER — Encounter (INDEPENDENT_AMBULATORY_CARE_PROVIDER_SITE_OTHER): Payer: Self-pay | Admitting: Physician Assistant

## 2024-09-25 VITALS — BP 145/81 | HR 67 | Temp 97.5°F | Ht 64.0 in | Wt 142.0 lb

## 2024-09-25 DIAGNOSIS — H6123 Impacted cerumen, bilateral: Secondary | ICD-10-CM

## 2024-09-25 NOTE — Progress Notes (Unsigned)
 Advised that patient take BP when she gets home. If BP is stil elevated see PCP.

## 2024-09-26 NOTE — Progress Notes (Signed)
 Dear Dr. Katina, Here is my assessment for our mutual patient, Tina Wagner. Thank you for allowing me the opportunity to care for your patient. Please do not hesitate to contact me should you have any other questions. Sincerely, Chyrl Cohen PA-C  Otolaryngology Clinic Note Referring provider: Dr. Katina HPI:  Tina Wagner is a 79 y.o. female kindly referred by Dr. Katina   Discussed the use of AI scribe software for clinical note transcription with the patient, who gave verbal consent to proceed.  History of Present Illness   [name] is a 79 year old female with hearing loss who presents for evaluation of bilateral cerumen impaction and persistent hearing difficulties.  She reports a persistent sensation of aural fullness and decreased hearing bilaterally, which is most pronounced in environments with background noise or in large rooms. She relies on closed captioning when watching television due to unclear sound quality. She denies otalgia.  Hearing temporarily improves following cerumen removal and with use of Signia hearing aids, but the benefit is not sustained. She does not frequently adjust her hearing aids, yet continues to experience difficulty hearing, particularly in complex auditory settings.  Several years ago, she underwent left ear cleaning at her primary care provider's office. Most recently, her audiologist identified cerumen and recommended evaluation by an otolaryngologist. She has not had otologic evaluation in over three years and expresses concern regarding recurrent cerumen accumulation.           Independent Review of Additional Tests or Records:  none   PMH/Meds/All/SocHx/FamHx/ROS:   Past Medical History:  Diagnosis Date   Atypical chest pain 12/09/2015   Palpitations 12/09/2015     No past surgical history on file.  Family History  Problem Relation Age of Onset   Osteoporosis Mother    COPD Mother    Heart disease Father    Lung disease  Maternal Grandmother    Heart disease Maternal Grandfather      Social Connections: Moderately Integrated (01/22/2024)   Social Connection and Isolation Panel    Frequency of Communication with Friends and Family: More than three times a week    Frequency of Social Gatherings with Friends and Family: Once a week    Attends Religious Services: More than 4 times per year    Active Member of [employer] or Organizations: Yes    Attends Banker Meetings: More than 4 times per year    Marital Status: Widowed     Current Medications[1]   Physical Exam:   BP (!) 145/81   Pulse 67   Temp (!) 97.5 F (36.4 C)   Ht 5' 4 (1.626 m)   Wt 142 lb (64.4 kg)   SpO2 98%   BMI 24.37 kg/m   Pertinent Findings  CN II-XII grossly intact Bilateral cerumen impaction  Anterior rhinoscopy: Septum midline; bilateral inferior turbinates with no hypertrophy No lesions of oral cavity/oropharynx; dentition WNL No obviously palpable neck masses/lymphadenopathy/thyromegaly No respiratory distress or stridor       Seprately Identifiable Procedures:  Procedure: Bilateral ear microscopy and cerumen removal using microscope (CPT [phone]) - Mod 50 Pre-procedure diagnosis: bilateral cerumen impaction external auditory canals Post-procedure diagnosis: same Indication: bilateral cerumen impaction; given patient's otologic complaints and history as well as for improved and comprehensive examination of external ear and tympanic membrane, bilateral otologic examination using microscope was performed and impacted cerumen removed  Procedure: Patient was placed semi-recumbent. Both ear canals were examined using the microscope with findings above. Cerumen removed  from bilateral external auditory canals using suction and currette with improvement in EAC examination and patency. Left: EAC was patent. TM was intact . Middle ear was aerated. Drainage: none Right: EAC was patent. TM was intact . Middle ear was  aerated . Drainage: none Patient tolerated the procedure well.   Impression & Plans:  Tina Wagner is a 79 y.o. female with the following   Assessment and Plan    Cerumen impaction  - Performed bilateral cerumen removal using suction under direct visualization. - Recommended follow-up every six months for otoscopic examination and cerumen removal as needed. - Advised return for earlier evaluation if symptoms recur.  Hearing loss Chronic, multifactorial hearing loss partially improves with cerumen removal and hearing aids. Persistent symptoms in noisy environments may need further audiologic assessment. - Discussed potential hearing improvement post-cerumen removal; further audiologic evaluation may be needed for persistent symptoms. - Encouraged continued use of hearing aids with adjustments as needed. - Advised monitoring for significant changes or worsening of hearing and to report such changes.           - f/u 6 months    Thank you for allowing me the opportunity to care for your patient. Please do not hesitate to contact me should you have any other questions.  Sincerely, Chyrl Cohen PA-C [facility] Phone: [phone] Fax: [phone]  09/26/2024, 8:36 AM        [1]  Current Outpatient Medications:    alendronate (FOSAMAX) 70 MG tablet, Take 70 mg by mouth once a week. Saturday, Disp: , Rfl:    amLODipine  (NORVASC ) 2.5 MG tablet, Take 1 tablet (2.5 mg total) by mouth every evening., Disp: 90 tablet, Rfl: 0   aspirin 81 MG chewable tablet, 1 tablet Orally Once a day, Disp: , Rfl:    Calcium Carb-Cholecalciferol 600-200 MG-UNIT TABS, Take 2 tablets by mouth daily at 12 noon., Disp: , Rfl:    latanoprost (XALATAN) 0.005 % ophthalmic solution, Place 1 drop into both eyes at bedtime., Disp: , Rfl:    LINZESS 72 MCG capsule, Take 72 mcg by mouth every morning. (Patient taking differently: Take 72 mcg by mouth. Takes 3 times a week), Disp: , Rfl:     meclizine (ANTIVERT) 12.5 MG tablet, Take 12.5 mg by mouth 2 (two) times daily as needed for nausea or dizziness., Disp: , Rfl:    Multiple Vitamins-Minerals (MULTIVITAMIN PO), Take 1 tablet by mouth daily., Disp: , Rfl:    rosuvastatin (CRESTOR) 10 MG tablet, , Disp: , Rfl:

## 2024-09-29 ENCOUNTER — Ambulatory Visit: Admitting: Physical Therapy

## 2024-10-06 ENCOUNTER — Ambulatory Visit: Admitting: Physical Therapy

## 2024-11-18 ENCOUNTER — Ambulatory Visit: Admitting: Physical Therapy

## 2025-03-26 ENCOUNTER — Ambulatory Visit (INDEPENDENT_AMBULATORY_CARE_PROVIDER_SITE_OTHER): Admitting: Physician Assistant
